# Patient Record
Sex: Female | Born: 1950 | Race: White | Hispanic: No | Marital: Married | Smoking: Never smoker
Health system: Southern US, Community
[De-identification: ages and names within clinical notes are randomized; demographics above are authoritative.]

## PROBLEM LIST (undated history)

## (undated) DIAGNOSIS — Z860100 Personal history of colon polyps, unspecified: Secondary | ICD-10-CM

## (undated) DIAGNOSIS — K589 Irritable bowel syndrome without diarrhea: Secondary | ICD-10-CM

## (undated) DIAGNOSIS — R630 Anorexia: Secondary | ICD-10-CM

## (undated) DIAGNOSIS — E2839 Other primary ovarian failure: Secondary | ICD-10-CM

## (undated) DIAGNOSIS — E288 Other ovarian dysfunction: Secondary | ICD-10-CM

## (undated) DIAGNOSIS — IMO0002 Reserved for concepts with insufficient information to code with codable children: Secondary | ICD-10-CM

## (undated) DIAGNOSIS — N951 Menopausal and female climacteric states: Secondary | ICD-10-CM

## (undated) DIAGNOSIS — M810 Age-related osteoporosis without current pathological fracture: Secondary | ICD-10-CM

## (undated) DIAGNOSIS — F319 Bipolar disorder, unspecified: Secondary | ICD-10-CM

## (undated) DIAGNOSIS — I700 Atherosclerosis of aorta: Secondary | ICD-10-CM

## (undated) HISTORY — DX: Age-related osteoporosis without current pathological fracture: M81.0

## (undated) HISTORY — DX: Personal history of colon polyps, unspecified: Z86.0100

## (undated) HISTORY — PX: TUBAL LIGATION: SHX77

## (undated) HISTORY — DX: Bipolar disorder, unspecified: F31.9

## (undated) HISTORY — DX: Reserved for concepts with insufficient information to code with codable children: IMO0002

## (undated) HISTORY — DX: Anorexia: R63.0

## (undated) HISTORY — DX: Personal history of colonic polyps: Z86.010

## (undated) HISTORY — DX: Other ovarian dysfunction: E28.8

## (undated) HISTORY — DX: Other primary ovarian failure: E28.39

## (undated) HISTORY — DX: Irritable bowel syndrome, unspecified: K58.9

## (undated) HISTORY — DX: Menopausal and female climacteric states: N95.1

## (undated) HISTORY — DX: Atherosclerosis of aorta: I70.0

## (undated) HISTORY — PX: TONSILLECTOMY AND ADENOIDECTOMY: SHX28

---

## 2010-10-01 LAB — HM MAMMOGRAPHY: HM Mammogram: NEGATIVE

## 2011-09-09 LAB — HM PAP SMEAR: HM Pap smear: NEGATIVE

## 2012-08-26 ENCOUNTER — Encounter: Payer: Self-pay | Admitting: *Deleted

## 2012-09-10 ENCOUNTER — Ambulatory Visit (INDEPENDENT_AMBULATORY_CARE_PROVIDER_SITE_OTHER): Payer: BC Managed Care – PPO | Admitting: Nurse Practitioner

## 2012-09-10 ENCOUNTER — Encounter: Payer: Self-pay | Admitting: Nurse Practitioner

## 2012-09-10 VITALS — BP 102/56 | HR 62 | Resp 12 | Ht 65.5 in | Wt 133.4 lb

## 2012-09-10 DIAGNOSIS — N952 Postmenopausal atrophic vaginitis: Secondary | ICD-10-CM

## 2012-09-10 DIAGNOSIS — Z0000 Encounter for general adult medical examination without abnormal findings: Secondary | ICD-10-CM

## 2012-09-10 DIAGNOSIS — Z01419 Encounter for gynecological examination (general) (routine) without abnormal findings: Secondary | ICD-10-CM

## 2012-09-10 DIAGNOSIS — M810 Age-related osteoporosis without current pathological fracture: Secondary | ICD-10-CM

## 2012-09-10 LAB — POCT URINALYSIS DIPSTICK
Leukocytes, UA: NEGATIVE
Spec Grav, UA: 1.015

## 2012-09-10 LAB — HEMOGLOBIN, FINGERSTICK: Hemoglobin, fingerstick: 14.1 g/dL (ref 12.0–16.0)

## 2012-09-10 MED ORDER — RALOXIFENE HCL 60 MG PO TABS: 60.0000 mg | ORAL_TABLET | Freq: Every day | ORAL | Status: DC

## 2012-09-10 MED ORDER — ESTRADIOL 10 MCG VA TABS: 10.0000 | ORAL_TABLET | VAGINAL | Status: DC

## 2012-09-10 NOTE — Patient Instructions (Signed)
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
## 2012-09-13 NOTE — Progress Notes (Signed)
Encounter reviewed by Dr. Brook Silva.  

## 2012-09-28 ENCOUNTER — Telehealth: Payer: Self-pay | Admitting: Nurse Practitioner

## 2012-09-28 NOTE — Telephone Encounter (Signed)
Patient called wanting to know about her schedule for her mammogram and bone density test.

## 2012-09-28 NOTE — Telephone Encounter (Signed)
Patient just saw Patty for AEX 09-10-12 but does not have any paper information to order BMD and MMG at [facility].  Would like to check on these tests.  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 Will schedule and call her back.

## 2012-09-29 ENCOUNTER — Telehealth: Payer: Self-pay | Admitting: *Deleted

## 2012-09-29 NOTE — Telephone Encounter (Signed)
See phone note for today 09/29/2012. Patient notified of appt. At [facility] for BMD and Mammogram.

## 2012-09-29 NOTE — Telephone Encounter (Signed)
Patient notified of scheduled test @ [facility] for Mammogram and Bone Density testing..July 30th,2014 @ arrive @8 :00am  For Dexa Scan @ 8:30am: Mammogram @ 9:00am. Form faxed to [facility].

## 2012-10-06 ENCOUNTER — Other Ambulatory Visit: Payer: Self-pay | Admitting: Nurse Practitioner

## 2012-10-06 NOTE — Telephone Encounter (Signed)
VAGIFEM RX sent on 09/10/12 during AEX visit.  RX denied.

## 2012-10-12 ENCOUNTER — Other Ambulatory Visit: Payer: Self-pay | Admitting: Nurse Practitioner

## 2012-10-12 DIAGNOSIS — N760 Acute vaginitis: Secondary | ICD-10-CM

## 2012-10-12 MED ORDER — METRONIDAZOLE 0.75 % VA GEL: 1.0000 | Freq: Every day | VAGINAL | Status: DC

## 2012-10-12 NOTE — Telephone Encounter (Signed)
Have patient to use Metrogel HS X 5 then go back to Vagifem, she may have a bacterial infection now.  If then no better will need to re-evaluate

## 2012-10-12 NOTE — Telephone Encounter (Signed)
Patient notified of response to use Metrogel HS x 5 days then go back to Vagifem. If no better will need office visit with Shirlyn Goltz. Rx sent to pharmacy per P.Grubb, FNP

## 2012-10-12 NOTE — Telephone Encounter (Signed)
Pt is still having the discharge that she was seen for before. May be worse. Please call to advise.

## 2012-10-12 NOTE — Telephone Encounter (Signed)
Patient seen 09/10/2012 AEX April Grubb,FNP Patient being Tx with Vagi Fem 3 x week vaginal Patient wanted to let April know that this is no better , No change in discharge , Vaginal dischage of grayish , green,. No odor, no itching. No pain. 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  Please advise.

## 2012-10-23 ENCOUNTER — Telehealth: Payer: Self-pay | Admitting: Nurse Practitioner

## 2012-10-23 DIAGNOSIS — N760 Acute vaginitis: Secondary | ICD-10-CM

## 2012-10-23 MED ORDER — METRONIDAZOLE 0.75 % VA GEL: 1.0000 | Freq: Every day | VAGINAL | Status: DC

## 2012-10-23 NOTE — Telephone Encounter (Signed)
Patient notified of verbal response from Shirlyn Goltz , FNP, to continue Metronidazole vaginal gel x 5 days for vaginal discharge . New Rx sent to pharmacy per patient request.

## 2012-10-23 NOTE — Telephone Encounter (Signed)
Patient was in recently for her annual and she was treated for vaginal discharge ,she was prescribed Metronidazole for this . Was told to let Patty know if this worked . She is calling in to day to let her know that she is still having this issue.

## 2012-10-23 NOTE — Telephone Encounter (Signed)
Patient last AEX 09/10/2012 with Patty Grubb,FNP. Patient states she used medication x 5 days as prescribed Metronidazole for vaginal discharge. States the discharge stopped but has now returned. States no odor., no itching, yellow green discharge, no SA. Patient states she has 1/2 tube left of Rx. Should she continue another 5 days. Please advise.

## 2013-01-14 ENCOUNTER — Other Ambulatory Visit: Payer: Self-pay

## 2013-07-01 HISTORY — PX: COLONOSCOPY: SHX174

## 2013-09-17 ENCOUNTER — Telehealth: Payer: Self-pay

## 2013-09-17 DIAGNOSIS — M810 Age-related osteoporosis without current pathological fracture: Secondary | ICD-10-CM

## 2013-09-17 MED ORDER — RALOXIFENE HCL 60 MG PO TABS: 60.0000 mg | ORAL_TABLET | Freq: Every day | ORAL | Status: DC

## 2013-09-17 NOTE — Telephone Encounter (Signed)
Last AEX: 09/10/13 Last refill:/09/10/12 #30, 12 refills Current AEX: 09/27/13  Sent 1 refill to pharmacy to cover pt until her AEX  Encounter closed

## 2013-09-27 ENCOUNTER — Encounter: Payer: Self-pay | Admitting: Nurse Practitioner

## 2013-09-27 ENCOUNTER — Ambulatory Visit (INDEPENDENT_AMBULATORY_CARE_PROVIDER_SITE_OTHER): Payer: BC Managed Care – PPO | Admitting: Nurse Practitioner

## 2013-09-27 VITALS — BP 116/80 | HR 72 | Ht 65.5 in | Wt 128.0 lb

## 2013-09-27 DIAGNOSIS — M810 Age-related osteoporosis without current pathological fracture: Secondary | ICD-10-CM

## 2013-09-27 DIAGNOSIS — Z9289 Personal history of other medical treatment: Secondary | ICD-10-CM

## 2013-09-27 DIAGNOSIS — Z0000 Encounter for general adult medical examination without abnormal findings: Secondary | ICD-10-CM

## 2013-09-27 DIAGNOSIS — Z01419 Encounter for gynecological examination (general) (routine) without abnormal findings: Secondary | ICD-10-CM

## 2013-09-27 DIAGNOSIS — Z9189 Other specified personal risk factors, not elsewhere classified: Secondary | ICD-10-CM

## 2013-09-27 DIAGNOSIS — N952 Postmenopausal atrophic vaginitis: Secondary | ICD-10-CM

## 2013-09-27 LAB — COMPREHENSIVE METABOLIC PANEL
ALBUMIN: 4 g/dL (ref 3.5–5.2)
ALT: 13 U/L (ref 0–35)
AST: 23 U/L (ref 0–37)
Alkaline Phosphatase: 61 U/L (ref 39–117)
BUN: 14 mg/dL (ref 6–23)
CALCIUM: 9.2 mg/dL (ref 8.4–10.5)
CHLORIDE: 107 meq/L (ref 96–112)
CO2: 29 meq/L (ref 19–32)
Creat: 0.93 mg/dL (ref 0.50–1.10)
GLUCOSE: 62 mg/dL — AB (ref 70–99)
POTASSIUM: 3.5 meq/L (ref 3.5–5.3)
SODIUM: 143 meq/L (ref 135–145)
TOTAL PROTEIN: 7 g/dL (ref 6.0–8.3)
Total Bilirubin: 0.4 mg/dL (ref 0.2–1.2)

## 2013-09-27 LAB — POCT URINALYSIS DIPSTICK
BILIRUBIN UA: NEGATIVE
GLUCOSE UA: NEGATIVE
Ketones, UA: NEGATIVE
LEUKOCYTES UA: NEGATIVE
NITRITE UA: NEGATIVE
Protein, UA: NEGATIVE
RBC UA: NEGATIVE
UROBILINOGEN UA: NEGATIVE
pH, UA: 6

## 2013-09-27 LAB — HEMOGLOBIN, FINGERSTICK: HEMOGLOBIN, FINGERSTICK: 13.8 g/dL (ref 12.0–16.0)

## 2013-09-27 LAB — LIPID PANEL
CHOLESTEROL: 160 mg/dL (ref 0–200)
HDL: 106 mg/dL (ref >39)
LDL Cholesterol: 46 mg/dL (ref 0–99)
TRIGLYCERIDES: 38 mg/dL (ref <150)
Total CHOL/HDL Ratio: 1.5 Ratio
VLDL: 8 mg/dL (ref 0–40)

## 2013-09-27 LAB — TSH: TSH: 3.416 u[IU]/mL (ref 0.350–4.500)

## 2013-09-27 MED ORDER — ESTRADIOL 10 MCG VA TABS: 1.0000 | ORAL_TABLET | VAGINAL | Status: DC

## 2013-09-27 MED ORDER — RALOXIFENE HCL 60 MG PO TABS: 60.0000 mg | ORAL_TABLET | Freq: Every day | ORAL | Status: DC

## 2013-09-27 MED ORDER — ESTRADIOL 10 MCG VA TABS: 10.0000 | ORAL_TABLET | VAGINAL | Status: DC

## 2013-09-27 NOTE — Patient Instructions (Signed)
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
## 2013-09-28 LAB — VITAMIN D 25 HYDROXY (VIT D DEFICIENCY, FRACTURES): Vit D, 25-Hydroxy: 78 ng/mL (ref 30–89)

## 2013-09-28 NOTE — Progress Notes (Signed)
Encounter reviewed by Dr. Shabnam Ladd Silva.  

## 2014-01-10 ENCOUNTER — Encounter: Payer: Self-pay | Admitting: Nurse Practitioner

## 2014-10-07 ENCOUNTER — Encounter: Payer: Self-pay | Admitting: Nurse Practitioner

## 2014-10-07 ENCOUNTER — Ambulatory Visit (INDEPENDENT_AMBULATORY_CARE_PROVIDER_SITE_OTHER): Payer: BC Managed Care – PPO | Admitting: Nurse Practitioner

## 2014-10-07 VITALS — BP 100/60 | HR 64 | Resp 16 | Ht 65.5 in | Wt 127.0 lb

## 2014-10-07 DIAGNOSIS — M810 Age-related osteoporosis without current pathological fracture: Secondary | ICD-10-CM

## 2014-10-07 DIAGNOSIS — Z01419 Encounter for gynecological examination (general) (routine) without abnormal findings: Secondary | ICD-10-CM | POA: Diagnosis not present

## 2014-10-07 DIAGNOSIS — Z0000 Encounter for general adult medical examination without abnormal findings: Secondary | ICD-10-CM | POA: Diagnosis not present

## 2014-10-07 LAB — POCT URINALYSIS DIPSTICK
Bilirubin, UA: NEGATIVE
Blood, UA: NEGATIVE
GLUCOSE UA: NEGATIVE
KETONES UA: NEGATIVE
Leukocytes, UA: NEGATIVE
NITRITE UA: NEGATIVE
Protein, UA: NEGATIVE
Urobilinogen, UA: NEGATIVE
pH, UA: 5

## 2014-10-07 LAB — COMPREHENSIVE METABOLIC PANEL
ALK PHOS: 56 U/L (ref 33–130)
ALT: 14 U/L (ref 6–29)
AST: 27 U/L (ref 10–35)
Albumin: 4.2 g/dL (ref 3.6–5.1)
BILIRUBIN TOTAL: 0.4 mg/dL (ref 0.2–1.2)
BUN: 12 mg/dL (ref 7–25)
CO2: 30 mmol/L (ref 20–31)
Calcium: 9.5 mg/dL (ref 8.6–10.4)
Chloride: 106 mmol/L (ref 98–110)
Creat: 0.91 mg/dL (ref 0.50–0.99)
GLUCOSE: 73 mg/dL (ref 65–99)
Potassium: 4.1 mmol/L (ref 3.5–5.3)
Sodium: 143 mmol/L (ref 135–146)
TOTAL PROTEIN: 7.2 g/dL (ref 6.1–8.1)

## 2014-10-07 LAB — LIPID PANEL
CHOL/HDL RATIO: 1.6 ratio (ref <=5.0)
CHOLESTEROL: 169 mg/dL (ref 125–200)
HDL: 105 mg/dL (ref >=46)
LDL Cholesterol: 49 mg/dL (ref <130)
TRIGLYCERIDES: 73 mg/dL (ref <150)
VLDL: 15 mg/dL (ref <30)

## 2014-10-07 LAB — VITAMIN D 25 HYDROXY (VIT D DEFICIENCY, FRACTURES): VIT D 25 HYDROXY: 57 ng/mL (ref 30–100)

## 2014-10-07 LAB — TSH: TSH: 3.514 u[IU]/mL (ref 0.350–4.500)

## 2014-10-07 MED ORDER — RALOXIFENE HCL 60 MG PO TABS: 60.0000 mg | ORAL_TABLET | Freq: Every day | ORAL | Status: DC

## 2014-10-07 NOTE — Patient Instructions (Signed)
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
## 2014-10-08 ENCOUNTER — Encounter: Payer: Self-pay | Admitting: Nurse Practitioner

## 2014-10-08 NOTE — Progress Notes (Signed)
Encounter reviewed by Dr. Aundria Rud. Patient will need follow up bone density.

## 2014-10-10 ENCOUNTER — Other Ambulatory Visit: Payer: Self-pay | Admitting: Nurse Practitioner

## 2014-10-10 NOTE — Telephone Encounter (Signed)
10/07/14 #30/12 rfs was sent to [facility] in [place]- rx denied.

## 2014-10-12 ENCOUNTER — Encounter: Payer: Self-pay | Admitting: Nurse Practitioner

## 2014-10-12 LAB — IPS PAP TEST WITH HPV

## 2014-10-12 LAB — HEMOGLOBIN, FINGERSTICK: Hemoglobin, fingerstick: 14.8 g/dL (ref 12.0–16.0)

## 2014-12-28 ENCOUNTER — Encounter: Payer: Self-pay | Admitting: Obstetrics and Gynecology

## 2015-04-19 HISTORY — PX: ESOPHAGOGASTRODUODENOSCOPY: SHX1529

## 2015-09-06 ENCOUNTER — Encounter: Payer: Self-pay | Admitting: Nurse Practitioner

## 2015-10-11 ENCOUNTER — Other Ambulatory Visit: Payer: Self-pay

## 2015-10-11 DIAGNOSIS — M810 Age-related osteoporosis without current pathological fracture: Secondary | ICD-10-CM

## 2015-10-11 MED ORDER — RALOXIFENE HCL 60 MG PO TABS: 60.0000 mg | ORAL_TABLET | Freq: Every day | ORAL | 0 refills | Status: DC

## 2015-10-11 NOTE — Telephone Encounter (Signed)
Medication refill request: Raloxifene 60mg  Last AEX:  10/07/14 PG Next AEX: 10/17/15 Last MMG (if hormonal medication request): 08/29/15 BI-RADS 1 negative Refill authorized: 10/07/14 #30 w/12 refills; today #30 w/0 refills? Patient has appt coming up 10/17/15

## 2015-10-12 ENCOUNTER — Other Ambulatory Visit: Payer: Self-pay

## 2015-10-12 DIAGNOSIS — M810 Age-related osteoporosis without current pathological fracture: Secondary | ICD-10-CM

## 2015-10-12 NOTE — Telephone Encounter (Signed)
Fax received from pharmacy requesting a 90-day supply instead of #30   Medication refill request: Raloxifene 60mg  Last AEX:  10/07/14 PG Next AEX: 10/17/15  Last MMG (if hormonal medication request): 08/29/15 BIRADS 1 negative Refill authorized: 10/07/14 #30 w/12 refills; today #90 w/0?

## 2015-10-13 MED ORDER — RALOXIFENE HCL 60 MG PO TABS: 60.0000 mg | ORAL_TABLET | Freq: Every day | ORAL | 0 refills | Status: DC

## 2015-10-17 ENCOUNTER — Encounter: Payer: Self-pay | Admitting: Nurse Practitioner

## 2015-10-17 ENCOUNTER — Ambulatory Visit (INDEPENDENT_AMBULATORY_CARE_PROVIDER_SITE_OTHER): Payer: BC Managed Care – PPO | Admitting: Nurse Practitioner

## 2015-10-17 VITALS — BP 120/76 | HR 64 | Ht 65.25 in | Wt 133.0 lb

## 2015-10-17 DIAGNOSIS — M810 Age-related osteoporosis without current pathological fracture: Secondary | ICD-10-CM | POA: Diagnosis not present

## 2015-10-17 DIAGNOSIS — Z79899 Other long term (current) drug therapy: Secondary | ICD-10-CM | POA: Diagnosis not present

## 2015-10-17 DIAGNOSIS — E559 Vitamin D deficiency, unspecified: Secondary | ICD-10-CM | POA: Diagnosis not present

## 2015-10-17 DIAGNOSIS — Z0000 Encounter for general adult medical examination without abnormal findings: Secondary | ICD-10-CM | POA: Diagnosis not present

## 2015-10-17 DIAGNOSIS — Z01419 Encounter for gynecological examination (general) (routine) without abnormal findings: Secondary | ICD-10-CM

## 2015-10-17 LAB — CBC WITH DIFFERENTIAL/PLATELET
BASOS PCT: 1 %
Basophils Absolute: 70 cells/uL (ref 0–200)
EOS ABS: 280 {cells}/uL (ref 15–500)
EOS PCT: 4 %
HCT: 43.5 % (ref 35.0–45.0)
HEMOGLOBIN: 14.6 g/dL (ref 11.7–15.5)
LYMPHS PCT: 22 %
Lymphs Abs: 1540 cells/uL (ref 850–3900)
MCH: 30.7 pg (ref 27.0–33.0)
MCHC: 33.6 g/dL (ref 32.0–36.0)
MCV: 91.6 fL (ref 80.0–100.0)
MONOS PCT: 6 %
MPV: 10 fL (ref 7.5–12.5)
Monocytes Absolute: 420 cells/uL (ref 200–950)
Neutro Abs: 4690 cells/uL (ref 1500–7800)
Neutrophils Relative %: 67 %
Platelets: 285 10*3/uL (ref 140–400)
RBC: 4.75 MIL/uL (ref 3.80–5.10)
RDW: 13.6 % (ref 11.0–15.0)
WBC: 7 10*3/uL (ref 3.8–10.8)

## 2015-10-17 LAB — POCT URINALYSIS DIPSTICK
BILIRUBIN UA: NEGATIVE
Glucose, UA: NEGATIVE
Ketones, UA: NEGATIVE
Leukocytes, UA: NEGATIVE
Nitrite, UA: NEGATIVE
Protein, UA: NEGATIVE
RBC UA: NEGATIVE
UROBILINOGEN UA: NEGATIVE
pH, UA: 7

## 2015-10-17 MED ORDER — RALOXIFENE HCL 60 MG PO TABS: 60.0000 mg | ORAL_TABLET | Freq: Every day | ORAL | 0 refills | Status: DC

## 2015-10-17 MED ORDER — RALOXIFENE HCL 60 MG PO TABS: 60.0000 mg | ORAL_TABLET | Freq: Every day | ORAL | 4 refills | Status: DC

## 2015-10-17 NOTE — Patient Instructions (Signed)
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
## 2015-10-17 NOTE — Progress Notes (Signed)
Reviewed personally.  M. Suzanne Maricela Schreur, MD.  

## 2015-10-18 LAB — LIPID PANEL
Cholesterol: 174 mg/dL (ref 125–200)
HDL: 115 mg/dL (ref >=46)
LDL Cholesterol: 50 mg/dL (ref <130)
Total CHOL/HDL Ratio: 1.5 Ratio (ref <=5.0)
Triglycerides: 47 mg/dL (ref <150)
VLDL: 9 mg/dL (ref <30)

## 2015-10-18 LAB — HEPATITIS C ANTIBODY: HCV AB: NEGATIVE

## 2015-10-18 LAB — COMPREHENSIVE METABOLIC PANEL
ALBUMIN: 4.1 g/dL (ref 3.6–5.1)
ALK PHOS: 68 U/L (ref 33–130)
ALT: 13 U/L (ref 6–29)
AST: 25 U/L (ref 10–35)
BILIRUBIN TOTAL: 0.3 mg/dL (ref 0.2–1.2)
BUN: 18 mg/dL (ref 7–25)
CO2: 24 mmol/L (ref 20–31)
CREATININE: 0.85 mg/dL (ref 0.50–0.99)
Calcium: 9.6 mg/dL (ref 8.6–10.4)
Chloride: 103 mmol/L (ref 98–110)
Glucose, Bld: 83 mg/dL (ref 65–99)
Potassium: 4.9 mmol/L (ref 3.5–5.3)
SODIUM: 142 mmol/L (ref 135–146)
TOTAL PROTEIN: 7.1 g/dL (ref 6.1–8.1)

## 2015-10-18 LAB — VITAMIN D 25 HYDROXY (VIT D DEFICIENCY, FRACTURES): VIT D 25 HYDROXY: 59 ng/mL (ref 30–100)

## 2015-10-18 LAB — HIV ANTIBODY (ROUTINE TESTING W REFLEX): HIV: NONREACTIVE

## 2015-10-18 LAB — TSH: TSH: 2.78 m[IU]/L

## 2015-10-19 LAB — HEMOGLOBIN, FINGERSTICK: Hemoglobin, fingerstick: 14.5 g/dL (ref 12.0–16.0)

## 2015-11-02 ENCOUNTER — Other Ambulatory Visit: Payer: Self-pay | Admitting: Nurse Practitioner

## 2015-11-02 DIAGNOSIS — M810 Age-related osteoporosis without current pathological fracture: Secondary | ICD-10-CM

## 2016-04-01 ENCOUNTER — Telehealth: Payer: Self-pay | Admitting: Nurse Practitioner

## 2016-04-01 NOTE — Telephone Encounter (Signed)
Call to [facility] [phone]. Fax [phone]. Patient scheduled for BMD 04/10/16 at 0900. Advise patient to arrive at 0830 and take no calcium or vitamins 24 hours prior.   Order to Kem Boroughs, NP to sign.  Spoke with patient, advised as seen above. Patient verbalizes understanding and is agreeable.  Routing to provider for final review. Patient is agreeable to disposition. Will close encounter.

## 2016-04-01 NOTE — Telephone Encounter (Signed)
Patient is calling to have an order sent to [facility] for a bone density test.

## 2016-04-16 ENCOUNTER — Telehealth: Payer: Self-pay | Admitting: Nurse Practitioner

## 2016-04-16 NOTE — Telephone Encounter (Addendum)
Please let patient know that BMD done on 04/10/16 shows a T Score at the spine of -1.8 and right hip at -2.8. The hip measurement puts her in Osteoporosis range.  In comparison to 2014 and 2012 she is declining in her hip measurements.  She has been on Evista but this is not stopping the bone loss.  Now needs to think about other treatment options.  She needs to see Dr. Quincy Simmonds here to discuss.

## 2016-04-17 NOTE — Telephone Encounter (Signed)
Spoke with patient, advised as seen below per Kem Boroughs, NP. Patient scheduled for 04/29/16 at 0930 with Dr. Quincy Simmonds. Patient verbalizes understanding and is agreeable.  Routing to provider for final review. Patient is agreeable to disposition. Will close encounter.  Copy of BMD results to Dr. Quincy Simmonds  Cc: Dr. Quincy Simmonds

## 2016-04-29 ENCOUNTER — Encounter: Payer: Self-pay | Admitting: Obstetrics and Gynecology

## 2016-04-29 ENCOUNTER — Ambulatory Visit (INDEPENDENT_AMBULATORY_CARE_PROVIDER_SITE_OTHER): Payer: BC Managed Care – PPO | Admitting: Obstetrics and Gynecology

## 2016-04-29 VITALS — BP 110/70 | HR 86 | Resp 16 | Ht 65.25 in | Wt 132.0 lb

## 2016-04-29 DIAGNOSIS — M810 Age-related osteoporosis without current pathological fracture: Secondary | ICD-10-CM | POA: Diagnosis not present

## 2016-04-29 MED ORDER — RISEDRONATE SODIUM 35 MG PO TABS: 35.0000 mg | ORAL_TABLET | ORAL | 2 refills | Status: DC

## 2016-04-29 NOTE — Patient Instructions (Signed)
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
## 2016-05-06 ENCOUNTER — Telehealth: Payer: Self-pay | Admitting: Nurse Practitioner

## 2016-05-06 MED ORDER — RALOXIFENE HCL 60 MG PO TABS: 60.0000 mg | ORAL_TABLET | Freq: Every day | ORAL | 4 refills | Status: DC

## 2016-05-06 NOTE — Telephone Encounter (Signed)
Spoke with patient. Advised of message as seen below from [place]. Patient verbalizes understanding. Rx for Evista 60 mg daily #90 4RF sent to pharmacy on file.  Routing to provider for final review. Patient agreeable to disposition. Will close encounter.

## 2016-05-06 NOTE — Telephone Encounter (Signed)
Spoke with patient. Patient took her first dose of Actonel on 04/30/2016. On Saturday 05/04/2016 she began to have severe heartburn and nausea for 12 hours. Reports she took first dose of Actonel with a full glass of water, did not eat, drink, or lay down for 30 minutes after taking. Reports she feels much better now and does not have any symptoms. Denies eating anything new or spicy that could have caused symptoms. Patient does not feel comfortable with these side effects. Requesting try an alternative. States she does not want to try Prolia or Reclast. Advised I will review with Dr.Silva and return call with further recommendations. Patient is agreeable. Advised not to take second dose of Actonel at this time until reviewed with Dr.Silva.

## 2016-05-06 NOTE — Telephone Encounter (Signed)
Patient can discontinue Actonel and return to Evista 60 mg daily until her next annual exam. Repeat BMD in 2 years.  Continue weight bearing exercise, calcium ( 3 - 4 servings daily) and vit D as we discussed.

## 2016-05-06 NOTE — Telephone Encounter (Signed)
Patient is having side effects from taking Actonel. She has been having heartburn and nausea.

## 2016-09-17 ENCOUNTER — Telehealth: Payer: Self-pay | Admitting: Obstetrics & Gynecology

## 2016-09-17 NOTE — Telephone Encounter (Signed)
Left message regarding upcoming appointment has been canceled and needs to be rescheduled. °

## 2016-10-21 ENCOUNTER — Ambulatory Visit: Payer: BC Managed Care – PPO | Admitting: Nurse Practitioner
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
## 2016-11-06 ENCOUNTER — Ambulatory Visit (INDEPENDENT_AMBULATORY_CARE_PROVIDER_SITE_OTHER): Payer: BC Managed Care – PPO | Admitting: Obstetrics and Gynecology

## 2016-11-06 ENCOUNTER — Encounter: Payer: Self-pay | Admitting: Obstetrics and Gynecology

## 2016-11-06 VITALS — BP 110/60 | HR 72 | Resp 16 | Ht 65.0 in | Wt 131.0 lb

## 2016-11-06 DIAGNOSIS — Z01419 Encounter for gynecological examination (general) (routine) without abnormal findings: Secondary | ICD-10-CM | POA: Diagnosis not present

## 2016-11-06 MED ORDER — RALOXIFENE HCL 60 MG PO TABS: 60.0000 mg | ORAL_TABLET | Freq: Every day | ORAL | 3 refills | Status: DC

## 2016-11-06 NOTE — Progress Notes (Signed)
Spoke with Estill Bamberg at [facility]. Patient scheduled while in office for screening MMG on 11/22/16 at 9am. Patient is agreeable to date and time.  Written order faxed to [facility] at [phone].

## 2016-11-06 NOTE — Patient Instructions (Signed)

## 2016-11-07 LAB — COMPREHENSIVE METABOLIC PANEL
A/G RATIO: 1.6 (ref 1.2–2.2)
ALT: 12 IU/L (ref 0–32)
AST: 29 IU/L (ref 0–40)
Albumin: 4.5 g/dL (ref 3.6–4.8)
Alkaline Phosphatase: 66 IU/L (ref 39–117)
BILIRUBIN TOTAL: 0.2 mg/dL (ref 0.0–1.2)
BUN/Creatinine Ratio: 15 (ref 12–28)
BUN: 15 mg/dL (ref 8–27)
CHLORIDE: 99 mmol/L (ref 96–106)
CO2: 26 mmol/L (ref 20–29)
Calcium: 10 mg/dL (ref 8.7–10.3)
Creatinine, Ser: 0.98 mg/dL (ref 0.57–1.00)
GFR calc non Af Amer: 62 mL/min/{1.73_m2} (ref >59)
GFR, EST AFRICAN AMERICAN: 71 mL/min/{1.73_m2} (ref >59)
Globulin, Total: 2.8 g/dL (ref 1.5–4.5)
Glucose: 85 mg/dL (ref 65–99)
POTASSIUM: 4.5 mmol/L (ref 3.5–5.2)
SODIUM: 140 mmol/L (ref 134–144)
Total Protein: 7.3 g/dL (ref 6.0–8.5)

## 2016-11-07 LAB — LIPID PANEL
CHOL/HDL RATIO: 1.6 ratio (ref 0.0–4.4)
Cholesterol, Total: 173 mg/dL (ref 100–199)
HDL: 106 mg/dL (ref >39)
LDL CALC: 56 mg/dL (ref 0–99)
TRIGLYCERIDES: 53 mg/dL (ref 0–149)
VLDL Cholesterol Cal: 11 mg/dL (ref 5–40)

## 2016-11-07 LAB — CBC
HEMOGLOBIN: 14.9 g/dL (ref 11.1–15.9)
Hematocrit: 45.6 % (ref 34.0–46.6)
MCH: 30.4 pg (ref 26.6–33.0)
MCHC: 32.7 g/dL (ref 31.5–35.7)
MCV: 93 fL (ref 79–97)
PLATELETS: 292 10*3/uL (ref 150–379)
RBC: 4.9 x10E6/uL (ref 3.77–5.28)
RDW: 13.9 % (ref 12.3–15.4)
WBC: 6.9 10*3/uL (ref 3.4–10.8)

## 2017-07-25 ENCOUNTER — Encounter: Payer: Self-pay | Admitting: Obstetrics and Gynecology
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
## 2017-11-28 ENCOUNTER — Ambulatory Visit: Payer: BC Managed Care – PPO | Admitting: Obstetrics and Gynecology

## 2017-11-28 ENCOUNTER — Other Ambulatory Visit (HOSPITAL_COMMUNITY)
Admission: RE | Admit: 2017-11-28 | Discharge: 2017-11-28 | Disposition: A | Discharge status: Home / Self Care | Payer: BC Managed Care – PPO | Source: Ambulatory Visit | Attending: Obstetrics and Gynecology | Admitting: Obstetrics and Gynecology

## 2017-11-28 ENCOUNTER — Encounter: Payer: Self-pay | Admitting: Obstetrics and Gynecology

## 2017-11-28 ENCOUNTER — Other Ambulatory Visit: Payer: Self-pay

## 2017-11-28 VITALS — BP 112/70 | HR 80 | Resp 16 | Ht 65.5 in | Wt 134.5 lb

## 2017-11-28 DIAGNOSIS — R1032 Left lower quadrant pain: Secondary | ICD-10-CM | POA: Diagnosis not present

## 2017-11-28 DIAGNOSIS — Z01419 Encounter for gynecological examination (general) (routine) without abnormal findings: Secondary | ICD-10-CM | POA: Insufficient documentation

## 2017-11-28 MED ORDER — RALOXIFENE HCL 60 MG PO TABS: 60.0000 mg | ORAL_TABLET | Freq: Every day | ORAL | 3 refills | Status: DC

## 2017-11-28 NOTE — Patient Instructions (Signed)

## 2017-11-29 LAB — CBC
HEMATOCRIT: 43.8 % (ref 34.0–46.6)
HEMOGLOBIN: 14.6 g/dL (ref 11.1–15.9)
MCH: 30.2 pg (ref 26.6–33.0)
MCHC: 33.3 g/dL (ref 31.5–35.7)
MCV: 91 fL (ref 79–97)
Platelets: 312 10*3/uL (ref 150–450)
RBC: 4.83 x10E6/uL (ref 3.77–5.28)
RDW: 12.8 % (ref 12.3–15.4)
WBC: 6.1 10*3/uL (ref 3.4–10.8)

## 2017-11-29 LAB — COMPREHENSIVE METABOLIC PANEL
ALBUMIN: 4.4 g/dL (ref 3.6–4.8)
ALT: 13 IU/L (ref 0–32)
AST: 26 IU/L (ref 0–40)
Albumin/Globulin Ratio: 1.5 (ref 1.2–2.2)
Alkaline Phosphatase: 64 IU/L (ref 39–117)
BUN / CREAT RATIO: 14 (ref 12–28)
BUN: 14 mg/dL (ref 8–27)
Bilirubin Total: <0.2 mg/dL (ref 0.0–1.2)
CO2: 26 mmol/L (ref 20–29)
CREATININE: 0.97 mg/dL (ref 0.57–1.00)
Calcium: 10 mg/dL (ref 8.7–10.3)
Chloride: 98 mmol/L (ref 96–106)
GFR, EST AFRICAN AMERICAN: 71 mL/min/{1.73_m2} (ref >59)
GFR, EST NON AFRICAN AMERICAN: 62 mL/min/{1.73_m2} (ref >59)
GLOBULIN, TOTAL: 2.9 g/dL (ref 1.5–4.5)
GLUCOSE: 83 mg/dL (ref 65–99)
Potassium: 4.7 mmol/L (ref 3.5–5.2)
SODIUM: 139 mmol/L (ref 134–144)
TOTAL PROTEIN: 7.3 g/dL (ref 6.0–8.5)

## 2017-11-29 LAB — LIPID PANEL
CHOL/HDL RATIO: 1.8 ratio (ref 0.0–4.4)
CHOLESTEROL TOTAL: 176 mg/dL (ref 100–199)
HDL: 99 mg/dL (ref >39)
LDL CALC: 66 mg/dL (ref 0–99)
TRIGLYCERIDES: 56 mg/dL (ref 0–149)
VLDL Cholesterol Cal: 11 mg/dL (ref 5–40)

## 2017-11-29 LAB — TSH: TSH: 2.25 u[IU]/mL (ref 0.450–4.500)

## 2017-11-29 LAB — VITAMIN D 25 HYDROXY (VIT D DEFICIENCY, FRACTURES): Vit D, 25-Hydroxy: 61.7 ng/mL (ref 30.0–100.0)

## 2017-12-01 LAB — CYTOLOGY - PAP: DIAGNOSIS: NEGATIVE

## 2017-12-04 ENCOUNTER — Ambulatory Visit: Payer: BC Managed Care – PPO | Admitting: Obstetrics and Gynecology

## 2017-12-04 ENCOUNTER — Encounter: Payer: Self-pay | Admitting: Obstetrics and Gynecology

## 2017-12-04 ENCOUNTER — Ambulatory Visit (INDEPENDENT_AMBULATORY_CARE_PROVIDER_SITE_OTHER): Payer: BC Managed Care – PPO

## 2017-12-04 VITALS — BP 122/64 | HR 66 | Resp 14 | Ht 65.5 in | Wt 134.0 lb

## 2017-12-04 DIAGNOSIS — G8929 Other chronic pain: Secondary | ICD-10-CM

## 2017-12-04 DIAGNOSIS — M545 Low back pain: Secondary | ICD-10-CM | POA: Diagnosis not present

## 2017-12-04 DIAGNOSIS — R1032 Left lower quadrant pain: Secondary | ICD-10-CM
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
## 2017-12-15 ENCOUNTER — Encounter: Payer: Self-pay | Admitting: Obstetrics and Gynecology

## 2018-02-18 DIAGNOSIS — F319 Bipolar disorder, unspecified: Secondary | ICD-10-CM | POA: Diagnosis not present

## 2018-02-19 DIAGNOSIS — C44519 Basal cell carcinoma of skin of other part of trunk: Secondary | ICD-10-CM | POA: Diagnosis not present

## 2018-02-19 DIAGNOSIS — L309 Dermatitis, unspecified: Secondary | ICD-10-CM | POA: Diagnosis not present

## 2018-02-19 DIAGNOSIS — B351 Tinea unguium: Secondary | ICD-10-CM | POA: Diagnosis not present

## 2018-02-19 DIAGNOSIS — L299 Pruritus, unspecified: Secondary | ICD-10-CM | POA: Diagnosis not present

## 2018-02-23 ENCOUNTER — Telehealth: Payer: Self-pay

## 2018-02-23 NOTE — Telephone Encounter (Signed)
Dr. Lyndel Safe, please advise on next colonoscopy date-previous patient from [facility];

## 2018-02-24 NOTE — Telephone Encounter (Signed)
This is on your desk for review.

## 2018-02-24 NOTE — Telephone Encounter (Signed)
April Duffy Can you find out when is the recall from our old system and infrom. Also, add to the new recall log. If any problems, pl print out the last colon report/biopsies and let me know. Thanks RG

## 2018-02-26 NOTE — Telephone Encounter (Signed)
Recall colonoscopy was 06/2016. Can set her up for colonoscopy for history of tubular adenomas. Please inform the patient

## 2018-02-27 NOTE — Telephone Encounter (Signed)
Called patient and left message for her to call back and schedule a pre-visit appointment.

## 2018-02-27 NOTE — Telephone Encounter (Signed)
Scheduled patient for a visit.

## 2018-03-09 ENCOUNTER — Encounter: Payer: Self-pay | Admitting: Gastroenterology

## 2018-03-16 ENCOUNTER — Ambulatory Visit (INDEPENDENT_AMBULATORY_CARE_PROVIDER_SITE_OTHER): Payer: PPO | Admitting: Gastroenterology

## 2018-03-16 ENCOUNTER — Encounter: Payer: Self-pay | Admitting: Gastroenterology

## 2018-03-16 VITALS — BP 128/84 | HR 73 | Ht 65.0 in | Wt 136.5 lb

## 2018-03-16 DIAGNOSIS — Z86010 Personal history of colonic polyps: Secondary | ICD-10-CM | POA: Diagnosis not present

## 2018-03-16 MED ORDER — SUPREP BOWEL PREP KIT 17.5-3.13-1.6 GM/177ML PO SOLN: 1.0000 | ORAL | 0 refills | Status: DC
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
## 2018-03-16 NOTE — Patient Instructions (Signed)
If you are age 67 or older, your body mass index should be between 23-30. Your Body mass index is 22.71 kg/m. If this is out of the aforementioned range listed, please consider follow up with your Primary Care Provider.  If you are age 68 or younger, your body mass index should be between 19-25. Your Body mass index is 22.71 kg/m. If this is out of the aformentioned range listed, please consider follow up with your Primary Care Provider.   You have been scheduled for a colonoscopy. Please follow written instructions given to you at your visit today.  Please pick up your prep supplies at the pharmacy within the next 1-3 days. If you use inhalers (even only as needed), please bring them with you on the day of your procedure. Your physician has requested that you go to www.startemmi.com and enter the access code given to you at your visit today. This web site gives a general overview about your procedure. However, you should still follow specific instructions given to you by our office regarding your preparation for the procedure.  We have given you samples of the following medication to take: Suprep  Thank you,  Dr. Jackquline Denmark

## 2018-03-25 ENCOUNTER — Encounter: Payer: Self-pay | Admitting: Gastroenterology

## 2018-03-25 ENCOUNTER — Ambulatory Visit (AMBULATORY_SURGERY_CENTER): Payer: PPO | Admitting: Gastroenterology

## 2018-03-25 VITALS — BP 116/68 | HR 60 | Temp 97.5°F | Resp 12 | Ht 65.0 in | Wt 136.0 lb

## 2018-03-25 DIAGNOSIS — K219 Gastro-esophageal reflux disease without esophagitis: Secondary | ICD-10-CM | POA: Diagnosis not present

## 2018-03-25 DIAGNOSIS — Z86010 Personal history of colonic polyps: Secondary | ICD-10-CM | POA: Diagnosis not present

## 2018-03-25 MED ORDER — SODIUM CHLORIDE 0.9 % IV SOLN: 500.0000 mL | INTRAVENOUS | Status: DC

## 2018-03-25 NOTE — Op Note (Signed)
[place] Patient Name: April Duffy Procedure Date: 03/25/2018 8:04 AM MRN: [id number] Endoscopist: Jackquline Denmark , MD Age: 68 Referring MD:  Date of Birth: 10-27-1950 Gender: Female Account #: [id number] Procedure:                Colonoscopy Indications:              High risk colon cancer surveillance: Personal                            history of colonic polyps Medicines:                Monitored Anesthesia Care Procedure:                Pre-Anesthesia Assessment:                           - Prior to the procedure, a History and Physical                            was performed, and patient medications and                            allergies were reviewed. The patient's tolerance of                            previous anesthesia was also reviewed. The risks                            and benefits of the procedure and the sedation                            options and risks were discussed with the patient.                            All questions were answered, and informed consent                            was obtained. Prior Anticoagulants: The patient has                            taken no previous anticoagulant or antiplatelet                            agents. ASA Grade Assessment: II - A patient with                            mild systemic disease. After reviewing the risks                            and benefits, the patient was deemed in                            satisfactory condition to undergo the procedure.  After obtaining informed consent, the colonoscope                            was passed under direct vision. Throughout the                            procedure, the patient's blood pressure, pulse, and                            oxygen saturations were monitored continuously. The                            Colonoscope was introduced through the anus and                            advanced to the 2 cm into the ileum. The                             colonoscopy was performed without difficulty. The                            patient tolerated the procedure well. The quality                            of the bowel preparation was excellent except in                            some areas of the right colon where there was some                            stool. Aggressive suctioning and aspiration was                            performed. The patient also turn from side to the                            back. Overall the examination was adequate. Colon                            was highly redundant. Scope In: 8:15:46 AM Scope Out: 8:33:50 AM Scope Withdrawal Time: 0 hours 11 minutes 28 seconds  Total Procedure Duration: 0 hours 18 minutes 4 seconds  Findings:                 A few rare small-mouthed diverticula were found in                            the sigmoid colon.                           Non-bleeding internal hemorrhoids were found during                            retroflexion. The hemorrhoids were small.  The terminal ileum appeared normal.                           The exam was otherwise without abnormality on                            direct and retroflexion views. Colon was highly                            redundant. Complications:            No immediate complications. Estimated Blood Loss:     Estimated blood loss: none. Impression:               -Minimal sigmoid diverticulosis.                           -Small Non-bleeding internal hemorrhoids.                           -Otherwise normal colonoscopy to TI. Recommendation:           - Patient has a contact number available for                            emergencies. The signs and symptoms of potential                            delayed complications were discussed with the                            patient. Return to normal activities tomorrow.                            Written discharge instructions were provided  to the                            patient.                           - Resume previous diet.                           - Continue present medications.                           - Repeat colonoscopy in 5 years for screening                            purposes due to his history of significant polyps..                           - Return to GI clinic PRN. Jackquline Denmark, MD 03/25/2018 8:41:16 AM This report has been signed electronically.

## 2018-03-25 NOTE — Patient Instructions (Signed)
YOU HAD AN ENDOSCOPIC PROCEDURE TODAY AT [facility]:   Refer to the procedure report that was given to you for any specific questions about what was found during the examination.  If the procedure report does not answer your questions, please call your gastroenterologist to clarify.  If you requested that your care partner not be given the details of your procedure findings, then the procedure report has been included in a sealed envelope for you to review at your convenience later.  YOU SHOULD EXPECT: Some feelings of bloating in the abdomen. Passage of more gas than usual.  Walking can help get rid of the air that was put into your GI tract during the procedure and reduce the bloating. If you had a lower endoscopy (such as a colonoscopy or flexible sigmoidoscopy) you may notice spotting of blood in your stool or on the toilet paper. If you underwent a bowel prep for your procedure, you may not have a normal bowel movement for a few days.  Please Note:  You might notice some irritation and congestion in your nose or some drainage.  This is from the oxygen used during your procedure.  There is no need for concern and it should clear up in a day or so.  SYMPTOMS TO REPORT IMMEDIATELY:   Following lower endoscopy (colonoscopy or flexible sigmoidoscopy):  Excessive amounts of blood in the stool  Significant tenderness or worsening of abdominal pains  Swelling of the abdomen that is new, acute  Fever of 100F or higher  For urgent or emergent issues, a gastroenterologist can be reached at any hour by calling [phone].   DIET:  We do recommend a small meal at first, but then you may proceed to your regular diet.  Drink plenty of fluids but you should avoid alcoholic beverages for 24 hours.  Try to eat a high fiber diet, and drink plenty of water.  ACTIVITY:  You should plan to take it easy for the rest of today and you should NOT DRIVE or use heavy machinery until tomorrow  (because of the sedation medicines used during the test).    FOLLOW UP: Our staff will call the number listed on your records the next business day following your procedure to check on you and address any questions or concerns that you may have regarding the information given to you following your procedure. If we do not reach you, we will leave a message.  However, if you are feeling well and you are not experiencing any problems, there is no need to return our call.  We will assume that you have returned to your regular daily activities without incident.   SIGNATURES/CONFIDENTIALITY: You and/or your care partner have signed paperwork which will be entered into your electronic medical record.  These signatures attest to the fact that that the information above on your After Visit Summary has been reviewed and is understood.  Full responsibility of the confidentiality of this discharge information lies with you and/or your care-partner.  Read all of the handouts given to you by your recovery room nurse.  Recall 10 years.

## 2018-03-25 NOTE — Progress Notes (Signed)
Report given to PACU, vss 

## 2018-03-26 ENCOUNTER — Telehealth: Payer: Self-pay

## 2018-03-26 ENCOUNTER — Telehealth: Payer: Self-pay | Admitting: *Deleted

## 2018-03-26 NOTE — Telephone Encounter (Signed)
  Follow up Call-  Call back number 03/25/2018  Post procedure Call Back phone  # [phone]  Permission to leave phone message Yes  Some recent data might be hidden     Patient questions:  Do you have a fever, pain , or abdominal swelling? No Pain Score  0  Have you tolerated food without any problems? yes  Have you been able to return to your normal activities? yes  Do you have any questions about your discharge instructions: Diet   no Medications  no Follow up visit  no  Do you have questions or concerns about your Care? no  Actions: * If pain score is 4 or above: Pain 0

## 2018-03-26 NOTE — Telephone Encounter (Signed)
  Follow up Call-  Call back number 03/25/2018  Post procedure Call Back phone  # [phone]  Permission to leave phone message Yes  Some recent data might be hidden     Patient questions:  Do you have a fever, pain , or abdominal swelling? No. Pain Score  0 *  Have you tolerated food without any problems? Yes.    Have you been able to return to your normal activities? Yes.    Do you have any questions about your discharge instructions: Diet   No. Medications  No. Follow up visit  No.  Do you have questions or concerns about your Care? No.  Actions: * If pain score is 4 or above: No action needed, pain <4.

## 2018-03-26 NOTE — Telephone Encounter (Signed)
  Follow up Call-  Call back number 03/25/2018  Post procedure Call Back phone  # [phone]  Permission to leave phone message Yes  Some recent data might be hidden    [facility]

## 2018-05-04 ENCOUNTER — Telehealth: Payer: Self-pay | Admitting: Obstetrics and Gynecology

## 2018-05-04 NOTE — Telephone Encounter (Signed)
Spoke with patient. Patient states unable to schedule BMD at [facility] with written order provided at [facility]. Advised patient will fax new order for BMD to [facility]. Patient thankful for return call.

## 2018-05-04 NOTE — Telephone Encounter (Signed)
BMD order faxed to [facility].   Encounter closed.

## 2018-05-04 NOTE — Telephone Encounter (Signed)
Spoke with Isela. Patient needs BMD order faxed to [phone].   Last BMD 04/10/16: Osteoporosis, on Evista.  Last [facility] 11/28/17  Written order to Dr. Quincy Simmonds for signature.

## 2018-05-04 NOTE — Telephone Encounter (Signed)
Patient says she has an order for bone density scan but [facility] says our office need to call them. Their number [phone] extension [phone]

## 2018-05-11 DIAGNOSIS — M810 Age-related osteoporosis without current pathological fracture: Secondary | ICD-10-CM | POA: Diagnosis not present

## 2018-05-20 DIAGNOSIS — F319 Bipolar disorder, unspecified: Secondary | ICD-10-CM | POA: Diagnosis not present

## 2018-07-27 ENCOUNTER — Telehealth: Payer: Self-pay | Admitting: Obstetrics and Gynecology

## 2018-07-27 NOTE — Telephone Encounter (Signed)
Please contact patient in follow up to her BMD and mammogram received from [facility].   Her BMD shows osteoporosis of the hips and osteopenia of the spine.  The hips are slightly improved and the spine is unchanged from 2018.   Her mammogram is normal.  She may continue with the Evista. Our alternative is for her to use Prolia, which is an injection given every 6 months.   Prolia is more effective for treating osteoporosis, but it is a completely different medication.   If she would like to consider other options, she can schedule a WebEx visit or come in for an office with me.

## 2018-07-27 NOTE — Telephone Encounter (Signed)
Left message to call Kaitlyn at [phone]. 

## 2018-07-30 NOTE — Telephone Encounter (Signed)
Left message to call Claribel Sachs, RN at [facility] [phone].   

## 2018-07-30 NOTE — Telephone Encounter (Signed)
Spoke with patient, advised as seen below per Dr. Quincy Simmonds. Patient would like to continue Evista at this time, will further discuss at [facility] 12/02/18. Declines OV at this time, will return call to office for OV if desire earlier OV or WebEx. Patient verbalizes understanding and is agreeable..   Copy of BMD and MMG reports to scan.   Routing to provider for final review. Patient is agreeable to disposition. Will close encounter.

## 2018-08-19 DIAGNOSIS — F319 Bipolar disorder, unspecified: Secondary | ICD-10-CM | POA: Diagnosis not present

## 2018-10-01 DIAGNOSIS — Z6820 Body mass index (BMI) 20.0-20.9, adult: Secondary | ICD-10-CM | POA: Diagnosis not present

## 2018-10-01 DIAGNOSIS — L259 Unspecified contact dermatitis, unspecified cause: Secondary | ICD-10-CM | POA: Diagnosis not present

## 2018-10-06 ENCOUNTER — Other Ambulatory Visit: Payer: Self-pay | Admitting: *Deleted

## 2018-10-06 NOTE — Telephone Encounter (Signed)
Medication refill request: Raloxifene 60mg  Last [facility]:  11/28/17 Dr. Quincy Simmonds Next [facility]EX: 12/02/18  Last MMG (if hormonal medication request): 01/02/18 BIRADS1:neg  Refill authorized: 11/28/17 #90tabs/3R. Today #90/0R?

## 2018-10-07 MED ORDER — RALOXIFENE HCL 60 MG PO TABS: 60.0000 mg | ORAL_TABLET | Freq: Every day | ORAL | 0 refills | Status: DC

## 2018-10-20 DIAGNOSIS — Z0289 Encounter for other administrative examinations: Secondary | ICD-10-CM

## 2018-11-18 DIAGNOSIS — F319 Bipolar disorder, unspecified: Secondary | ICD-10-CM | POA: Diagnosis not present

## 2018-11-27 ENCOUNTER — Other Ambulatory Visit: Payer: Self-pay

## 2018-12-02 ENCOUNTER — Ambulatory Visit (INDEPENDENT_AMBULATORY_CARE_PROVIDER_SITE_OTHER): Payer: PPO | Admitting: Obstetrics and Gynecology

## 2018-12-02 ENCOUNTER — Encounter: Payer: Self-pay | Admitting: Obstetrics and Gynecology

## 2018-12-02 ENCOUNTER — Other Ambulatory Visit: Payer: Self-pay

## 2018-12-02 VITALS — BP 112/70 | HR 80 | Temp 97.2°F | Resp 12 | Ht 65.5 in | Wt 130.2 lb

## 2018-12-02 DIAGNOSIS — Z01419 Encounter for gynecological examination (general) (routine) without abnormal findings: Secondary | ICD-10-CM

## 2018-12-02 MED ORDER — RALOXIFENE HCL 60 MG PO TABS: 60.0000 mg | ORAL_TABLET | Freq: Every day | ORAL | 3 refills | Status: DC

## 2018-12-02 NOTE — Patient Instructions (Signed)
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
## 2018-12-11 ENCOUNTER — Telehealth: Payer: Self-pay | Admitting: Obstetrics and Gynecology

## 2018-12-11 NOTE — Telephone Encounter (Signed)
Spoke with patient, patient request morning appt.   Last screening MMG 01/02/18  Call placed to [facility]. Spoke with Danae Chen. Screening MMG scheduled for 02/16/2019 at 9:15am, arrive at 8:45am. Fax order to [phone]   Patient notified of appt and is agreeable to date and time.    Written order to Dr. Quincy Simmonds for signature and faxing.   Routing to provider for final review. Patient is agreeable to disposition. Will close encounter.

## 2018-12-11 NOTE — Telephone Encounter (Signed)
Patient says our office will need to call and schedule her mammogram with [facility], [place].

## 2018-12-30 DIAGNOSIS — Z23 Encounter for immunization: Secondary | ICD-10-CM | POA: Diagnosis not present

## 2018-12-30 DIAGNOSIS — Z6820 Body mass index (BMI) 20.0-20.9, adult: Secondary | ICD-10-CM | POA: Diagnosis not present

## 2018-12-30 DIAGNOSIS — Z5181 Encounter for therapeutic drug level monitoring: Secondary | ICD-10-CM | POA: Diagnosis not present

## 2019-01-04 ENCOUNTER — Telehealth: Payer: Self-pay | Admitting: Gastroenterology

## 2019-01-04 NOTE — Telephone Encounter (Signed)
Pt reported that she has been taking omeprazole for a year and is concerned about taking medications for a prolonged amount of time.  Please call back to discuss.

## 2019-01-04 NOTE — Telephone Encounter (Signed)
I have called and spoke with patient and she said that she is currently taking Omeprazole 20 mg once daily OTC. Patient said she has tried to wean herself off of it and skip a day here and there but her symptoms returned. She is concerned about taking this everyday and just wanted to make sure you thought that it was ok.

## 2019-01-05 NOTE — Telephone Encounter (Signed)
Should be all right Can certainly try it every other day. Should increase calcium intake -like calcium tablets 1500 mg p.o. once a day Should have follow-up in 6 months or so to discuss more  RG

## 2019-01-06 NOTE — Telephone Encounter (Signed)
I have called patient and left message for her to return my call.  

## 2019-01-06 NOTE — Telephone Encounter (Signed)
Patient returned called. Told her that per Dr Steve Rattler note she can try the every other day if she wants and that she can increase her calcium intake to 1500mg  by mouth once a day but follow up in 6 months but id she have symptoms sooner please call us

## 2019-01-26 DIAGNOSIS — M25552 Pain in left hip: Secondary | ICD-10-CM | POA: Diagnosis not present

## 2019-01-26 DIAGNOSIS — Z6820 Body mass index (BMI) 20.0-20.9, adult: Secondary | ICD-10-CM | POA: Diagnosis not present

## 2019-01-26 DIAGNOSIS — Z9181 History of falling: Secondary | ICD-10-CM | POA: Diagnosis not present

## 2019-02-09 DIAGNOSIS — M25552 Pain in left hip: Secondary | ICD-10-CM | POA: Diagnosis not present

## 2019-02-09 DIAGNOSIS — M545 Low back pain: Secondary | ICD-10-CM | POA: Diagnosis not present

## 2019-02-16 DIAGNOSIS — Z1231 Encounter for screening mammogram for malignant neoplasm of breast: Secondary | ICD-10-CM | POA: Diagnosis not present

## 2019-02-17 DIAGNOSIS — F319 Bipolar disorder, unspecified: Secondary | ICD-10-CM | POA: Diagnosis not present

## 2019-05-12 DIAGNOSIS — F319 Bipolar disorder, unspecified: Secondary | ICD-10-CM | POA: Diagnosis not present

## 2019-07-08 DIAGNOSIS — M545 Low back pain: Secondary | ICD-10-CM | POA: Diagnosis not present

## 2019-07-08 DIAGNOSIS — M47816 Spondylosis without myelopathy or radiculopathy, lumbar region: Secondary | ICD-10-CM | POA: Diagnosis not present

## 2019-07-08 DIAGNOSIS — Q762 Congenital spondylolisthesis: Secondary | ICD-10-CM | POA: Diagnosis not present

## 2019-07-08 DIAGNOSIS — M4316 Spondylolisthesis, lumbar region: Secondary | ICD-10-CM | POA: Diagnosis not present

## 2019-07-19 DIAGNOSIS — M4807 Spinal stenosis, lumbosacral region: Secondary | ICD-10-CM | POA: Diagnosis not present

## 2019-07-19 DIAGNOSIS — M4316 Spondylolisthesis, lumbar region: Secondary | ICD-10-CM | POA: Diagnosis not present

## 2019-07-19 DIAGNOSIS — M5126 Other intervertebral disc displacement, lumbar region: Secondary | ICD-10-CM | POA: Diagnosis not present

## 2019-07-27 DIAGNOSIS — M4316 Spondylolisthesis, lumbar region: Secondary | ICD-10-CM | POA: Diagnosis not present

## 2019-07-27 DIAGNOSIS — Q762 Congenital spondylolisthesis: Secondary | ICD-10-CM | POA: Diagnosis not present

## 2019-07-30 ENCOUNTER — Ambulatory Visit: Payer: PPO | Admitting: Gastroenterology

## 2019-08-11 DIAGNOSIS — F319 Bipolar disorder, unspecified: Secondary | ICD-10-CM | POA: Diagnosis not present

## 2019-09-07 ENCOUNTER — Other Ambulatory Visit (INDEPENDENT_AMBULATORY_CARE_PROVIDER_SITE_OTHER): Payer: PPO

## 2019-09-07 ENCOUNTER — Ambulatory Visit (INDEPENDENT_AMBULATORY_CARE_PROVIDER_SITE_OTHER): Payer: PPO | Admitting: Gastroenterology

## 2019-09-07 ENCOUNTER — Encounter: Payer: Self-pay | Admitting: Gastroenterology

## 2019-09-07 VITALS — BP 122/74 | HR 78 | Temp 97.3°F | Ht 65.0 in | Wt 138.1 lb

## 2019-09-07 DIAGNOSIS — R1032 Left lower quadrant pain: Secondary | ICD-10-CM | POA: Diagnosis not present

## 2019-09-07 DIAGNOSIS — K581 Irritable bowel syndrome with constipation: Secondary | ICD-10-CM | POA: Diagnosis not present

## 2019-09-07 LAB — CBC WITH DIFFERENTIAL/PLATELET
Basophils Absolute: 0.1 10*3/uL (ref 0.0–0.1)
Basophils Relative: 0.9 % (ref 0.0–3.0)
Eosinophils Absolute: 0.1 10*3/uL (ref 0.0–0.7)
Eosinophils Relative: 2.1 % (ref 0.0–5.0)
HCT: 42.1 % (ref 36.0–46.0)
Hemoglobin: 14.1 g/dL (ref 12.0–15.0)
Lymphocytes Relative: 28.1 % (ref 12.0–46.0)
Lymphs Abs: 1.7 10*3/uL (ref 0.7–4.0)
MCHC: 33.5 g/dL (ref 30.0–36.0)
MCV: 94.8 fl (ref 78.0–100.0)
Monocytes Absolute: 0.4 10*3/uL (ref 0.1–1.0)
Monocytes Relative: 7 % (ref 3.0–12.0)
Neutro Abs: 3.8 10*3/uL (ref 1.4–7.7)
Neutrophils Relative %: 61.9 % (ref 43.0–77.0)
Platelets: 279 10*3/uL (ref 150.0–400.0)
RBC: 4.44 Mil/uL (ref 3.87–5.11)
RDW: 13 % (ref 11.5–15.5)
WBC: 6.1 10*3/uL (ref 4.0–10.5)

## 2019-09-07 LAB — COMPREHENSIVE METABOLIC PANEL
ALT: 12 U/L (ref 0–35)
AST: 22 U/L (ref 0–37)
Albumin: 4.3 g/dL (ref 3.5–5.2)
Alkaline Phosphatase: 61 U/L (ref 39–117)
BUN: 19 mg/dL (ref 6–23)
CO2: 32 mEq/L (ref 19–32)
Calcium: 10.2 mg/dL (ref 8.4–10.5)
Chloride: 101 mEq/L (ref 96–112)
Creatinine, Ser: 0.9 mg/dL (ref 0.40–1.20)
GFR: 62.55 mL/min (ref >60.00)
Glucose, Bld: 91 mg/dL (ref 70–99)
Potassium: 4.4 mEq/L (ref 3.5–5.1)
Sodium: 140 mEq/L (ref 135–145)
Total Bilirubin: 0.3 mg/dL (ref 0.2–1.2)
Total Protein: 7.2 g/dL (ref 6.0–8.3)

## 2019-09-07 LAB — HIGH SENSITIVITY CRP: CRP, High Sensitivity: 7.21 mg/L — ABNORMAL HIGH (ref 0.000–5.000)
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
## 2019-09-07 NOTE — Patient Instructions (Addendum)
If you are age 67 or older, your body mass index should be between 23-30. Your Body mass index is 22.99 kg/m. If this is out of the aforementioned range listed, please consider follow up with your Primary Care Provider.  If you are age 64 or younger, your body mass index should be between 19-25. Your Body mass index is 22.99 kg/m. If this is out of the aformentioned range listed, please consider follow up with your Primary Care Provider.   You have been scheduled for a CT scan of the abdomen and pelvis at [facility]. You are scheduled on 09/17/19 at 8:30. You should arrive 15 minutes prior to your appointment time for registration. Please follow the written instructions below on the day of your exam:  WARNING: IF YOU ARE ALLERGIC TO IODINE/X-RAY DYE, PLEASE NOTIFY RADIOLOGY IMMEDIATELY AT [phone]! YOU WILL BE GIVEN A 13 HOUR PREMEDICATION PREP.  1) Do not eat or drink anything after 4:30 am (4 hours prior to your test) 2) You have been given 2 bottles of oral contrast to drink. The solution may taste better if refrigerated, but do NOT add ice or any other liquid to this solution. Shake well before drinking.    Drink 1 bottle of contrast @ 6:30 am (2 hours prior to your exam)  Drink 1 bottle of contrast @ 7:30 am (1 hour prior to your exam)  You may take any medications as prescribed with a small amount of water, if necessary. If you take any of the following medications: METFORMIN, GLUCOPHAGE, GLUCOVANCE, AVANDAMET, RIOMET, FORTAMET, [place] MET, JANUMET, GLUMETZA or METAGLIP, you MAY be asked to HOLD this medication 48 hours AFTER the exam.  The purpose of you drinking the oral contrast is to aid in the visualization of your intestinal tract. The contrast solution may cause some diarrhea. Depending on your individual set of symptoms, you may also receive an intravenous injection of x-ray contrast/dye. Plan on being at [facility] for 30 minutes or longer, depending on the  type of exam you are having performed.  This test typically takes 30-45 minutes to complete.  If you have any questions regarding your exam or if you need to reschedule, you may call the CT department at [phone] between the hours of 8:00 am and 5:00 pm, Monday-Friday.  ________________________________________________________________________  Your provider has requested that you go to the basement level for lab work at [place] [place] [employer] . Press "B" on the elevator. The lab is located at the first door on the left as you exit the elevator  Continue Miralax 3 x a week.  Can use Biofreeze and heating pads..  Thank you for choosing me and [facility].   Jackquline Denmark, MD

## 2019-09-16 DIAGNOSIS — M4317 Spondylolisthesis, lumbosacral region: Secondary | ICD-10-CM | POA: Diagnosis not present

## 2019-09-17 ENCOUNTER — Ambulatory Visit (HOSPITAL_COMMUNITY)
Admission: RE | Admit: 2019-09-17 | Discharge: 2019-09-17 | Disposition: A | Discharge status: Home / Self Care | Payer: PPO | Source: Ambulatory Visit | Attending: Gastroenterology | Admitting: Gastroenterology

## 2019-09-17 ENCOUNTER — Other Ambulatory Visit: Payer: Self-pay

## 2019-09-17 ENCOUNTER — Encounter (HOSPITAL_COMMUNITY): Payer: Self-pay

## 2019-09-17 DIAGNOSIS — R1032 Left lower quadrant pain: Secondary | ICD-10-CM

## 2019-09-17 DIAGNOSIS — M4319 Spondylolisthesis, multiple sites in spine: Secondary | ICD-10-CM | POA: Diagnosis not present

## 2019-09-17 DIAGNOSIS — I700 Atherosclerosis of aorta: Secondary | ICD-10-CM | POA: Diagnosis not present

## 2019-09-17 DIAGNOSIS — M47816 Spondylosis without myelopathy or radiculopathy, lumbar region: Secondary | ICD-10-CM | POA: Diagnosis not present

## 2019-09-17 MED ORDER — IOHEXOL 300 MG/ML  SOLN
100.0000 mL | Freq: Once | INTRAMUSCULAR | Status: AC | PRN
  Administered 2019-09-17: 100 mL via INTRAVENOUS

## 2019-09-17 MED ORDER — SODIUM CHLORIDE (PF) 0.9 % IJ SOLN
INTRAMUSCULAR | Status: AC
  Filled 2019-09-17: qty 50

## 2019-09-20 ENCOUNTER — Telehealth: Payer: Self-pay | Admitting: Gastroenterology

## 2019-09-20 NOTE — Telephone Encounter (Signed)
CT Abdo/pelvis-there is no comment about the heart.   Regardless, due to strong family history, left upper quadrant/left chest pain, she should see cardiology  Please refer her to cardiology group in [facility] med center (Dr K/Dr. Munley/Dr. Geraldo Pitter) -whoever can see her earlier   RG

## 2019-09-20 NOTE — Telephone Encounter (Signed)
Spoke to patient who is very concerned after reviewing her CT report. She states that she has a strong family history of heath and cardiovascular  Disease. Patient would like a referral to a cardiologist, and is there anything she can do preventatively regarding her results.

## 2019-09-20 NOTE — Telephone Encounter (Signed)
Pt is requesting a call back from a nurse in regards to her CT scan results. Pt states based on the results she would like for Dr Lyndel Safe to refer her to [facility].   Fax: [phone]

## 2019-09-21 ENCOUNTER — Other Ambulatory Visit: Payer: Self-pay | Admitting: Gastroenterology

## 2019-09-21 DIAGNOSIS — R1032 Left lower quadrant pain: Secondary | ICD-10-CM

## 2019-09-21 NOTE — Telephone Encounter (Signed)
Spoke to patient. Referral sent to cardiology. Patient requesting dr April Duffy at [facility] on [address].

## 2019-10-07 ENCOUNTER — Other Ambulatory Visit: Payer: Self-pay

## 2019-10-19 DIAGNOSIS — L219 Seborrheic dermatitis, unspecified: Secondary | ICD-10-CM | POA: Diagnosis not present

## 2019-10-27 DIAGNOSIS — J329 Chronic sinusitis, unspecified: Secondary | ICD-10-CM | POA: Diagnosis not present

## 2019-10-27 DIAGNOSIS — Z20828 Contact with and (suspected) exposure to other viral communicable diseases: Secondary | ICD-10-CM | POA: Diagnosis not present

## 2019-11-10 ENCOUNTER — Ambulatory Visit (INDEPENDENT_AMBULATORY_CARE_PROVIDER_SITE_OTHER): Payer: PPO | Admitting: Cardiology

## 2019-11-10 ENCOUNTER — Encounter: Payer: Self-pay | Admitting: Cardiology

## 2019-11-10 ENCOUNTER — Other Ambulatory Visit: Payer: Self-pay

## 2019-11-10 VITALS — BP 110/60 | HR 71 | Ht 65.0 in | Wt 137.0 lb

## 2019-11-10 DIAGNOSIS — I700 Atherosclerosis of aorta: Secondary | ICD-10-CM

## 2019-11-10 DIAGNOSIS — I712 Thoracic aortic aneurysm, without rupture, unspecified: Secondary | ICD-10-CM

## 2019-11-10 LAB — BASIC METABOLIC PANEL
BUN/Creatinine Ratio: 17 (ref 12–28)
BUN: 15 mg/dL (ref 8–27)
CO2: 26 mmol/L (ref 20–29)
Calcium: 10.2 mg/dL (ref 8.7–10.3)
Chloride: 100 mmol/L (ref 96–106)
Creatinine, Ser: 0.88 mg/dL (ref 0.57–1.00)
GFR calc Af Amer: 79 mL/min/{1.73_m2} (ref >59)
GFR calc non Af Amer: 69 mL/min/{1.73_m2} (ref >59)
Glucose: 77 mg/dL (ref 65–99)
Potassium: 4.5 mmol/L (ref 3.5–5.2)
Sodium: 141 mmol/L (ref 134–144)
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
## 2019-11-10 NOTE — Patient Instructions (Signed)
Medication Instructions:  Your physician recommends that you continue on your current medications as directed. Please refer to the Current Medication list given to you today.  *If you need a refill on your cardiac medications before your next appointment, please call your pharmacy*  Testing/Procedures: Your provider recommends that you have a Chest CT Angiography (CTA).  Your provider recommends that you have a Calcium Score CT Scan.   Follow-Up: At [facility], you and your health needs are our priority.  As part of our continuing mission to provide you with exceptional heart care, we have created designated Provider Care Teams.  These Care Teams include your primary Cardiologist (physician) and Advanced Practice Providers (APPs -  Physician Assistants and Nurse Practitioners) who all work together to provide you with the care you need, when you need it.   Your next appointment:   1 year(s)  The format for your next appointment:   In Person  Provider:   You may see Fransico Him, MD or one of the following Advanced Practice Providers on your designated Care Team:    Melina Copa, PA-C  Ermalinda Barrios, PA-C

## 2019-11-10 NOTE — Addendum Note (Signed)
Addended by: Antonieta Iba on: 11/10/2019 09:09 AM   Modules accepted: Orders

## 2019-11-10 NOTE — Addendum Note (Signed)
Addended by: Antonieta Iba on: 11/10/2019 09:18 AM   Modules accepted: Orders

## 2019-11-17 ENCOUNTER — Telehealth: Payer: Self-pay | Admitting: Cardiology

## 2019-11-17 DIAGNOSIS — F319 Bipolar disorder, unspecified: Secondary | ICD-10-CM | POA: Diagnosis not present

## 2019-11-17 NOTE — Telephone Encounter (Signed)
New Message  Pt is calling and is wondering if there is any dye that she needs to pick up before her CT   Please call

## 2019-11-17 NOTE — Telephone Encounter (Signed)
Spoke with the patient and advised her that there is nothing that she needs to pick up prior to her CT scan. Patient verbalized understanding.

## 2019-11-24 ENCOUNTER — Other Ambulatory Visit: Payer: PPO

## 2019-11-29 ENCOUNTER — Other Ambulatory Visit: Payer: Self-pay

## 2019-11-29 ENCOUNTER — Ambulatory Visit (INDEPENDENT_AMBULATORY_CARE_PROVIDER_SITE_OTHER)
Admission: RE | Admit: 2019-11-29 | Discharge: 2019-11-29 | Disposition: A | Discharge status: Home / Self Care | Payer: PPO | Source: Ambulatory Visit | Attending: Cardiology | Admitting: Cardiology

## 2019-11-29 ENCOUNTER — Ambulatory Visit (INDEPENDENT_AMBULATORY_CARE_PROVIDER_SITE_OTHER)
Admission: RE | Admit: 2019-11-29 | Discharge: 2019-11-29 | Disposition: A | Discharge status: Home / Self Care | Payer: Self-pay | Source: Ambulatory Visit | Attending: Cardiology | Admitting: Cardiology

## 2019-11-29 DIAGNOSIS — Z8679 Personal history of other diseases of the circulatory system: Secondary | ICD-10-CM | POA: Diagnosis not present

## 2019-11-29 DIAGNOSIS — I712 Thoracic aortic aneurysm, without rupture, unspecified: Secondary | ICD-10-CM

## 2019-11-29 DIAGNOSIS — J984 Other disorders of lung: Secondary | ICD-10-CM | POA: Diagnosis not present

## 2019-11-29 DIAGNOSIS — I700 Atherosclerosis of aorta: Secondary | ICD-10-CM

## 2019-11-29 MED ORDER — IOHEXOL 350 MG/ML SOLN
80.0000 mL | Freq: Once | INTRAVENOUS | Status: AC | PRN
  Administered 2019-11-29: 80 mL via INTRAVENOUS

## 2019-12-10 DIAGNOSIS — H35371 Puckering of macula, right eye: Secondary | ICD-10-CM | POA: Diagnosis not present

## 2019-12-10 DIAGNOSIS — H524 Presbyopia: Secondary | ICD-10-CM | POA: Diagnosis not present

## 2019-12-10 DIAGNOSIS — H25013 Cortical age-related cataract, bilateral: Secondary | ICD-10-CM | POA: Diagnosis not present

## 2019-12-10 DIAGNOSIS — H2513 Age-related nuclear cataract, bilateral: Secondary | ICD-10-CM | POA: Diagnosis not present

## 2019-12-23 DIAGNOSIS — H2511 Age-related nuclear cataract, right eye: Secondary | ICD-10-CM | POA: Diagnosis not present

## 2019-12-23 DIAGNOSIS — H2513 Age-related nuclear cataract, bilateral: Secondary | ICD-10-CM | POA: Diagnosis not present

## 2019-12-23 DIAGNOSIS — H25013 Cortical age-related cataract, bilateral: Secondary | ICD-10-CM | POA: Diagnosis not present

## 2020-01-10 ENCOUNTER — Other Ambulatory Visit: Payer: Self-pay

## 2020-01-10 HISTORY — PX: CATARACT EXTRACTION, BILATERAL: SHX1313

## 2020-01-10 NOTE — Telephone Encounter (Signed)
Patient is calling for a refill of Evista.

## 2020-01-10 NOTE — Telephone Encounter (Signed)
Medication refill request: Evista Last AEX:  12-02-2018 BS Next AEX: 04-11-20 Last MMG (if hormonal medication request): n/a Refill authorized: Today, please advise.   Medication pended for #90, 0RF. Please refill if appropriate.

## 2020-01-11 NOTE — Telephone Encounter (Signed)
Please contact patient in follow up to refill request for Evista.   I see on chart review that she has atherosclerosis of the aorta.   I recommend she discontinue the Evista at this time, as this can increase the risk of developing blood clots in the circulatory system.   Her next bone density is due in March, 2022.   Options for care right now are: - referral to endocrinology for further management and potential Prolia treatment. - get bone density in March, 2022 and then make a decision regarding her care plan.

## 2020-01-12 ENCOUNTER — Other Ambulatory Visit: Payer: Self-pay | Admitting: Obstetrics and Gynecology

## 2020-01-12 ENCOUNTER — Ambulatory Visit: Payer: PPO | Admitting: Obstetrics and Gynecology

## 2020-01-12 DIAGNOSIS — H25811 Combined forms of age-related cataract, right eye: Secondary | ICD-10-CM | POA: Diagnosis not present

## 2020-01-12 DIAGNOSIS — H2511 Age-related nuclear cataract, right eye: Secondary | ICD-10-CM | POA: Diagnosis not present

## 2020-01-12 NOTE — Telephone Encounter (Signed)
Spoke with pt. Pt given update and recommendations per Dr Quincy Simmonds. Pt agreeable to stop taking Evista and verbalized understanding. Pt states would like to wait to have next BMD in March 2022 and then make a decision on treatment.  Next AEX 04/11/2020 with Dr Quincy Simmonds  Routing to Dr Quincy Simmonds for review  Encounter closed.

## 2020-01-15 ENCOUNTER — Other Ambulatory Visit: Payer: Self-pay | Admitting: Obstetrics and Gynecology

## 2020-01-18 DIAGNOSIS — H2512 Age-related nuclear cataract, left eye: Secondary | ICD-10-CM | POA: Diagnosis not present

## 2020-01-18 DIAGNOSIS — H25012 Cortical age-related cataract, left eye: Secondary | ICD-10-CM | POA: Diagnosis not present

## 2020-01-26 DIAGNOSIS — H25012 Cortical age-related cataract, left eye: Secondary | ICD-10-CM | POA: Diagnosis not present

## 2020-01-26 DIAGNOSIS — H2512 Age-related nuclear cataract, left eye: Secondary | ICD-10-CM | POA: Diagnosis not present

## 2020-02-23 DIAGNOSIS — F319 Bipolar disorder, unspecified: Secondary | ICD-10-CM | POA: Diagnosis not present

## 2020-03-17 DIAGNOSIS — Z0000 Encounter for general adult medical examination without abnormal findings: Secondary | ICD-10-CM | POA: Diagnosis not present

## 2020-03-17 DIAGNOSIS — Z6822 Body mass index (BMI) 22.0-22.9, adult: Secondary | ICD-10-CM | POA: Diagnosis not present

## 2020-03-17 DIAGNOSIS — Z5181 Encounter for therapeutic drug level monitoring: Secondary | ICD-10-CM | POA: Diagnosis not present

## 2020-04-10 ENCOUNTER — Telehealth: Payer: Self-pay

## 2020-04-10 NOTE — Telephone Encounter (Signed)
AEX appt tomorrow at 3:30pm.  Wants labs and asked if she needs to be fasting. I advised we do not instruct patients to fast as what we do are considered screening labs. If a result comes back abnormal and requires lab follow up they may request she schedule lab appointment to return fasting early one morning to follow up.

## 2020-04-11 ENCOUNTER — Other Ambulatory Visit: Payer: Self-pay | Admitting: Obstetrics and Gynecology

## 2020-04-11 ENCOUNTER — Ambulatory Visit: Payer: PPO | Admitting: Obstetrics and Gynecology

## 2020-04-11 ENCOUNTER — Encounter: Payer: Self-pay | Admitting: Obstetrics and Gynecology

## 2020-04-11 ENCOUNTER — Other Ambulatory Visit: Payer: Self-pay

## 2020-04-11 VITALS — BP 130/76 | HR 88 | Ht 65.0 in | Wt 137.0 lb

## 2020-04-11 DIAGNOSIS — Z01411 Encounter for gynecological examination (general) (routine) with abnormal findings: Secondary | ICD-10-CM

## 2020-04-11 DIAGNOSIS — Z124 Encounter for screening for malignant neoplasm of cervix: Secondary | ICD-10-CM

## 2020-04-11 DIAGNOSIS — Z1239 Encounter for other screening for malignant neoplasm of breast: Secondary | ICD-10-CM | POA: Diagnosis not present

## 2020-04-11 DIAGNOSIS — M810 Age-related osteoporosis without current pathological fracture: Secondary | ICD-10-CM | POA: Diagnosis not present

## 2020-04-11 DIAGNOSIS — Z1231 Encounter for screening mammogram for malignant neoplasm of breast: Secondary | ICD-10-CM
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
## 2020-04-12 ENCOUNTER — Other Ambulatory Visit (HOSPITAL_COMMUNITY)
Admission: RE | Admit: 2020-04-12 | Discharge: 2020-04-12 | Disposition: A | Discharge status: Home / Self Care | Payer: PPO | Source: Ambulatory Visit | Attending: Obstetrics and Gynecology | Admitting: Obstetrics and Gynecology

## 2020-04-12 DIAGNOSIS — Z124 Encounter for screening for malignant neoplasm of cervix: Secondary | ICD-10-CM | POA: Diagnosis not present

## 2020-04-13 NOTE — Patient Instructions (Signed)

## 2020-04-14 LAB — CYTOLOGY - PAP: Diagnosis: NEGATIVE

## 2020-04-18 DIAGNOSIS — M4317 Spondylolisthesis, lumbosacral region: Secondary | ICD-10-CM | POA: Diagnosis not present

## 2020-05-17 DIAGNOSIS — F319 Bipolar disorder, unspecified: Secondary | ICD-10-CM | POA: Diagnosis not present

## 2020-05-29 DIAGNOSIS — D1801 Hemangioma of skin and subcutaneous tissue: Secondary | ICD-10-CM | POA: Diagnosis not present

## 2020-05-29 DIAGNOSIS — L821 Other seborrheic keratosis: Secondary | ICD-10-CM | POA: Diagnosis not present

## 2020-05-29 DIAGNOSIS — D225 Melanocytic nevi of trunk: Secondary | ICD-10-CM | POA: Diagnosis not present

## 2020-05-29 DIAGNOSIS — L218 Other seborrheic dermatitis: Secondary | ICD-10-CM | POA: Diagnosis not present

## 2020-05-29 DIAGNOSIS — L814 Other melanin hyperpigmentation: Secondary | ICD-10-CM | POA: Diagnosis not present

## 2020-05-29 DIAGNOSIS — L738 Other specified follicular disorders: Secondary | ICD-10-CM | POA: Diagnosis not present

## 2020-05-29 DIAGNOSIS — B078 Other viral warts: Secondary | ICD-10-CM | POA: Diagnosis not present

## 2020-07-13 DIAGNOSIS — B370 Candidal stomatitis: Secondary | ICD-10-CM | POA: Diagnosis not present

## 2020-07-13 DIAGNOSIS — Z6822 Body mass index (BMI) 22.0-22.9, adult: Secondary | ICD-10-CM | POA: Diagnosis not present

## 2020-07-13 DIAGNOSIS — Z6820 Body mass index (BMI) 20.0-20.9, adult: Secondary | ICD-10-CM | POA: Diagnosis not present

## 2020-08-16 DIAGNOSIS — F319 Bipolar disorder, unspecified: Secondary | ICD-10-CM | POA: Diagnosis not present

## 2020-08-22 DIAGNOSIS — H35371 Puckering of macula, right eye: Secondary | ICD-10-CM | POA: Diagnosis not present

## 2020-08-22 DIAGNOSIS — H524 Presbyopia: Secondary | ICD-10-CM | POA: Diagnosis not present

## 2020-08-22 DIAGNOSIS — H04123 Dry eye syndrome of bilateral lacrimal glands: Secondary | ICD-10-CM | POA: Diagnosis not present

## 2020-08-22 DIAGNOSIS — Z961 Presence of intraocular lens: Secondary | ICD-10-CM | POA: Diagnosis not present

## 2020-09-01 ENCOUNTER — Ambulatory Visit
Admission: RE | Admit: 2020-09-01 | Discharge: 2020-09-01 | Disposition: A | Discharge status: Home / Self Care | Payer: PPO | Source: Ambulatory Visit | Attending: Obstetrics and Gynecology | Admitting: Obstetrics and Gynecology

## 2020-09-01 ENCOUNTER — Other Ambulatory Visit: Payer: Self-pay

## 2020-09-01 DIAGNOSIS — M8588 Other specified disorders of bone density and structure, other site: Secondary | ICD-10-CM | POA: Diagnosis not present

## 2020-09-01 DIAGNOSIS — Z1231 Encounter for screening mammogram for malignant neoplasm of breast: Secondary | ICD-10-CM

## 2020-09-01 DIAGNOSIS — M810 Age-related osteoporosis without current pathological fracture: Secondary | ICD-10-CM | POA: Diagnosis not present

## 2020-09-01 DIAGNOSIS — Z780 Asymptomatic menopausal state: Secondary | ICD-10-CM | POA: Diagnosis not present

## 2020-09-13 DIAGNOSIS — F319 Bipolar disorder, unspecified: Secondary | ICD-10-CM | POA: Diagnosis not present

## 2020-09-29 DIAGNOSIS — B351 Tinea unguium: Secondary | ICD-10-CM | POA: Diagnosis not present

## 2020-09-29 DIAGNOSIS — Z6821 Body mass index (BMI) 21.0-21.9, adult: Secondary | ICD-10-CM | POA: Diagnosis not present

## 2020-10-16 DIAGNOSIS — M4317 Spondylolisthesis, lumbosacral region: Secondary | ICD-10-CM | POA: Diagnosis not present

## 2020-11-23 DIAGNOSIS — F319 Bipolar disorder, unspecified: Secondary | ICD-10-CM | POA: Diagnosis not present

## 2020-11-30 DIAGNOSIS — L814 Other melanin hyperpigmentation: Secondary | ICD-10-CM | POA: Diagnosis not present

## 2020-11-30 DIAGNOSIS — L570 Actinic keratosis: Secondary | ICD-10-CM | POA: Diagnosis not present

## 2020-11-30 DIAGNOSIS — L821 Other seborrheic keratosis: Secondary | ICD-10-CM | POA: Diagnosis not present

## 2020-11-30 DIAGNOSIS — D485 Neoplasm of uncertain behavior of skin: Secondary | ICD-10-CM | POA: Diagnosis not present

## 2020-11-30 DIAGNOSIS — D225 Melanocytic nevi of trunk: Secondary | ICD-10-CM | POA: Diagnosis not present

## 2020-12-08 ENCOUNTER — Ambulatory Visit: Payer: BC Managed Care – PPO | Admitting: Cardiology

## 2021-01-04 DIAGNOSIS — F319 Bipolar disorder, unspecified: Secondary | ICD-10-CM | POA: Diagnosis not present

## 2021-01-17 IMAGING — CT CT ANGIO CHEST
2 of 8 series · 17 of 46 positions shown · IV contrast (omnipaque)
Comparison: No priors.
COMPARISON: No priors.

Addendum:
CLINICAL DATA: 68-year-old female with history of thoracic aortic
aneurysm. Follow-up study.

EXAM:
CT ANGIOGRAPHY CHEST WITH CONTRAST
TECHNIQUE: Multidetector CT imaging of the chest was performed using the
standard protocol during bolus administration of intravenous
contrast. Multiplanar CT image reconstructions and MIPs were
obtained to evaluate the vascular anatomy.
CONTRAST:  80mL OMNIPAQUE IOHEXOL 350 MG/ML SOLN
CLINICAL DATA: Risk stratification
Coronary Calcium Score
TECHNIQUE: The patient was scanned on a Siemens Force scanner. Axial
non-contrast 3 mm slices were carried out through the heart. The
data set was analyzed on a dedicated work station and scored using
the Agatson method.

[Series 5: thins · axial · 0.65mm/px · z∈[+1322,+1576]mm · 14 of 287 slices shown]
[im 16/287  lung]
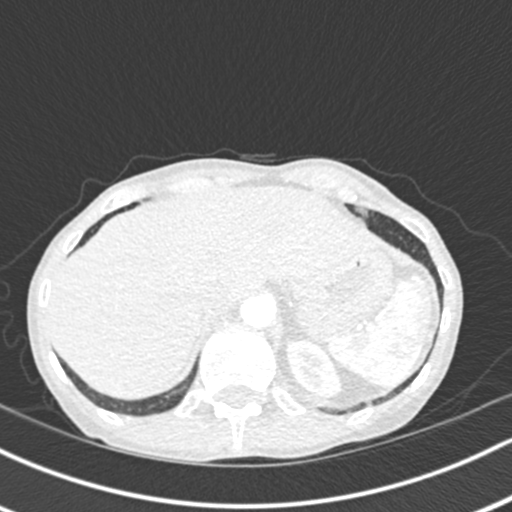
[im 32/287  soft-tissue]
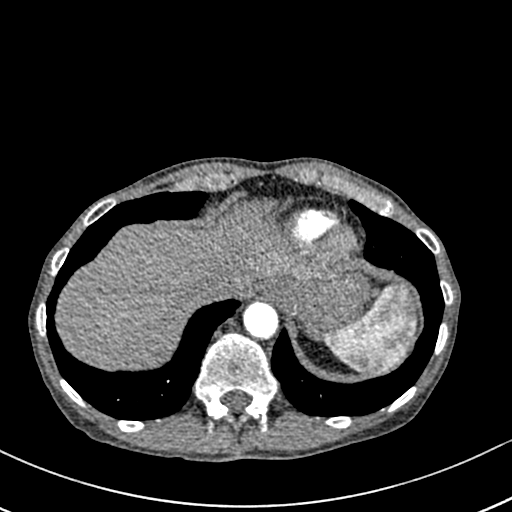
[im 64/287  lung]
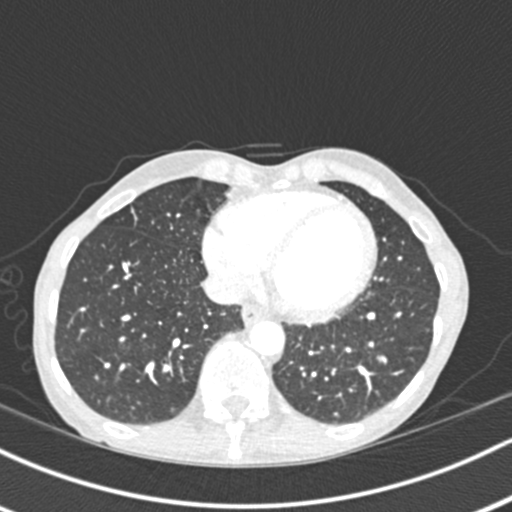
[im 80/287  soft-tissue]
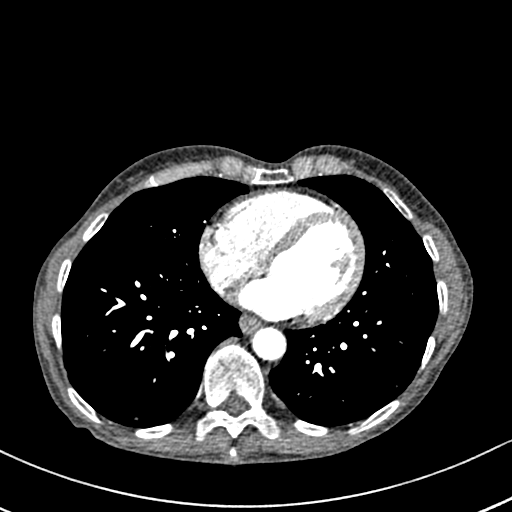
[im 96/287  lung]
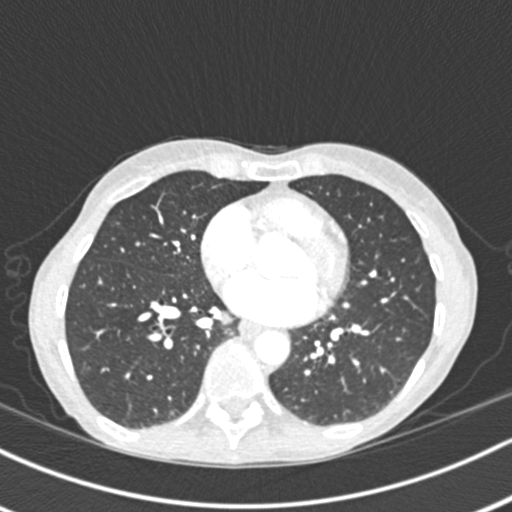
[im 112/287  soft-tissue]
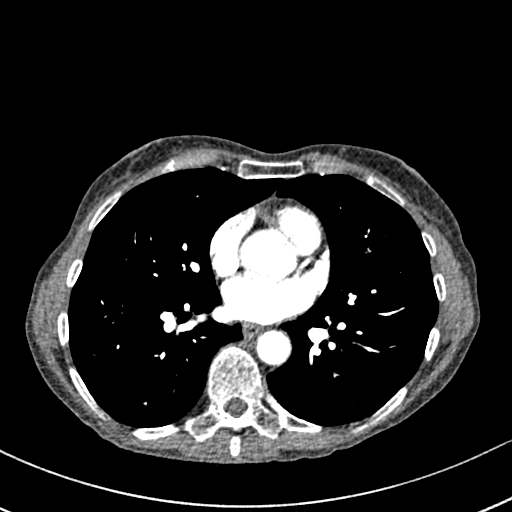
[im 128/287  lung]
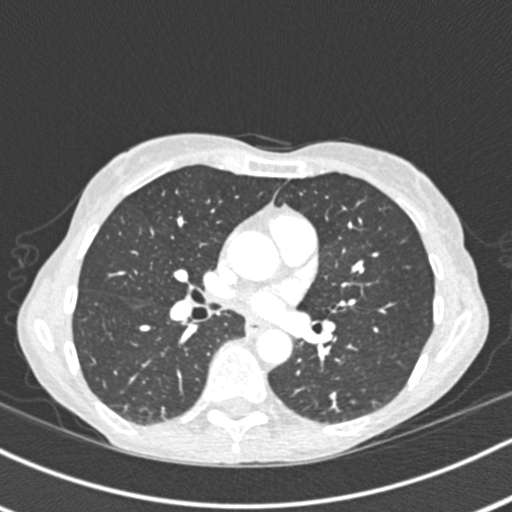
[im 159/287  soft-tissue]
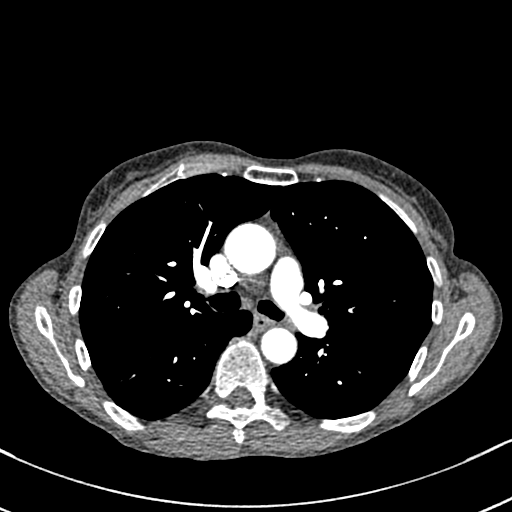
[im 175/287  lung]
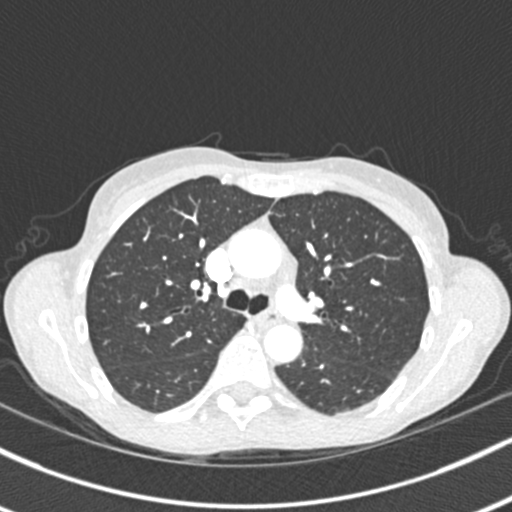
[im 191/287  soft-tissue]
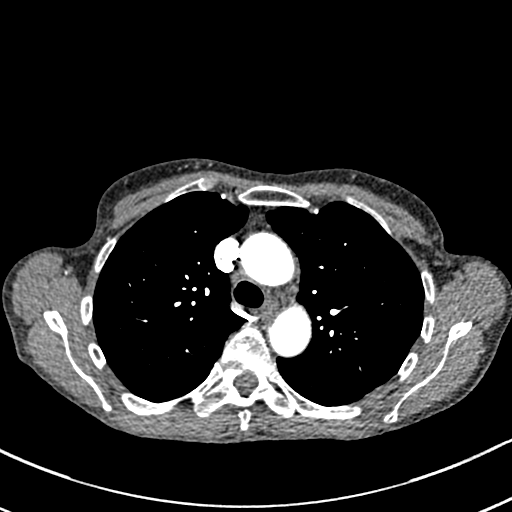
[im 207/287  lung]
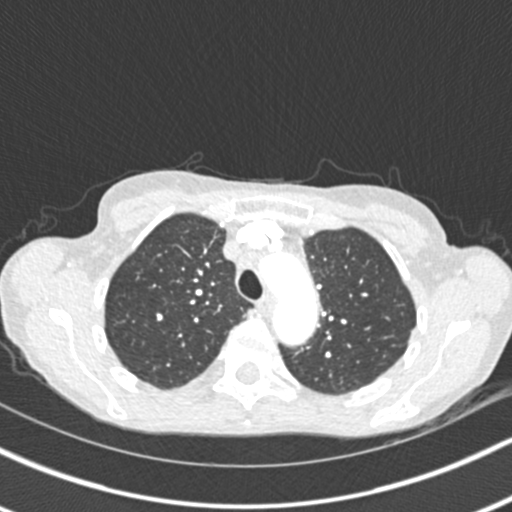
[im 223/287  soft-tissue]
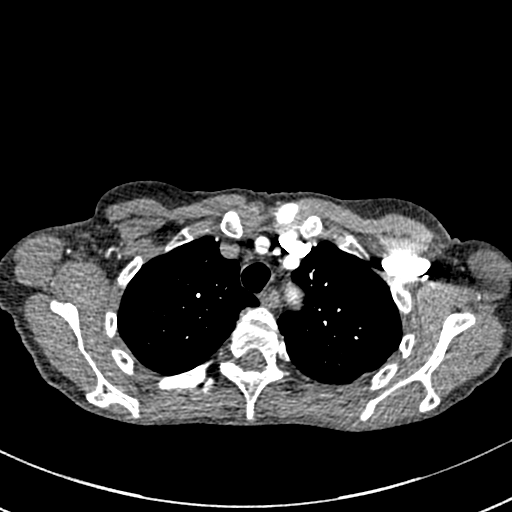
[im 255/287  lung]
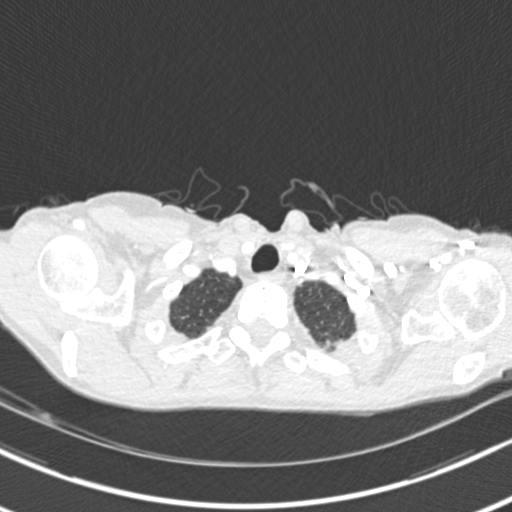
[im 271/287  soft-tissue]
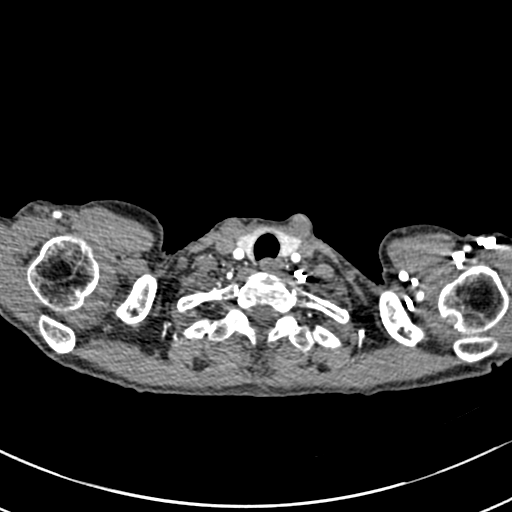

[Series 7: coronal mpr · coronal · 0.59mm/px · 3 of 137 slices shown]
[im 35/137  soft-tissue]
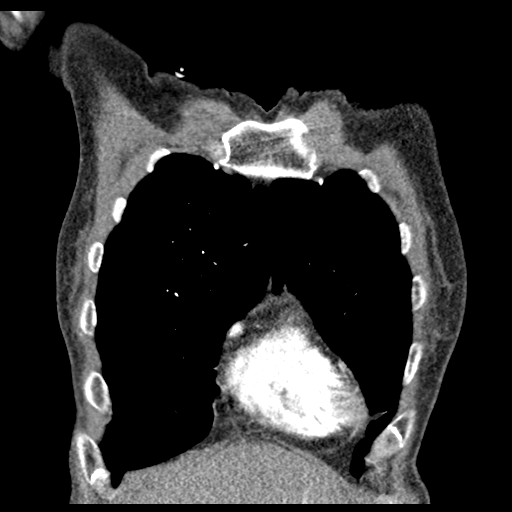
[im 69/137  soft-tissue]
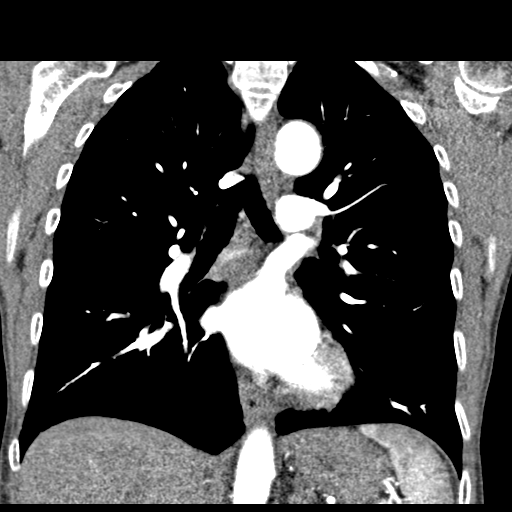
[im 103/137  soft-tissue]
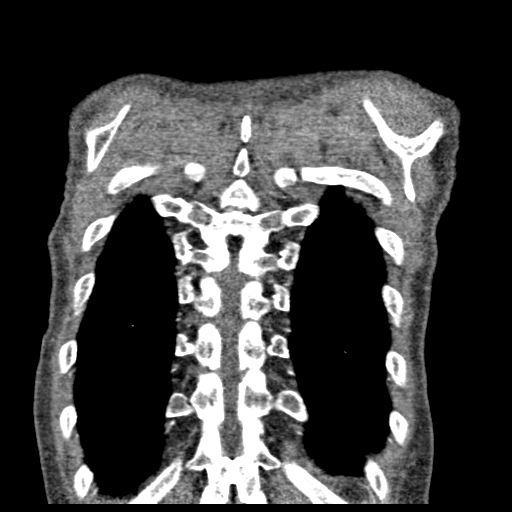

[17 of 46 positions shown; findings below may reference images not displayed]

FINDINGS: Cardiovascular: Heart size is normal. There is no significant
pericardial fluid, thickening or pericardial calcification. No
atherosclerotic calcifications in the thoracic aorta or the coronary
arteries. Ascending thoracic aorta measures 3.2 cm in diameter. Mid
aortic arch measures 2.5 cm in diameter. Descending thoracic aorta
measures 2.1 cm in diameter. No evidence of thoracic aortic
dissection.

Mediastinum/Nodes: No pathologically enlarged mediastinal or hilar
lymph nodes. Esophagus is unremarkable in appearance. No axillary
lymphadenopathy.

Lungs/Pleura: No suspicious appearing pulmonary nodules or masses
are noted. No acute consolidative airspace disease. No pleural
effusions. Mild linear scarring in the periphery of the lateral
segment of the right middle lobe and lateral aspect of the right
lower lobe.

Upper Abdomen: Unremarkable.

Musculoskeletal: There are no aggressive appearing lytic or blastic
lesions noted in the visualized portions of the skeleton.

Review of the MIP images confirms the above findings.
IMPRESSION: 1. No evidence of thoracic aortic aneurysm.
2. No acute findings in the thorax.
FINDINGS: Non-cardiac: See separate report from [REDACTED].

Ascending Aorta: Normal caliber and no calcifications.

Pericardium: Normal

Coronary arteries: Normal coronary origins.
IMPRESSION: Coronary calcium score of 0. This was 0 percentile for age and sex
matched control.

Jagod Arasim

*** End of Addendum ***
FINDINGS: Cardiovascular: Heart size is normal. There is no significant
pericardial fluid, thickening or pericardial calcification. No
atherosclerotic calcifications in the thoracic aorta or the coronary
arteries. Ascending thoracic aorta measures 3.2 cm in diameter. Mid
aortic arch measures 2.5 cm in diameter. Descending thoracic aorta
measures 2.1 cm in diameter. No evidence of thoracic aortic
dissection.

Mediastinum/Nodes: No pathologically enlarged mediastinal or hilar
lymph nodes. Esophagus is unremarkable in appearance. No axillary
lymphadenopathy.

Lungs/Pleura: No suspicious appearing pulmonary nodules or masses
are noted. No acute consolidative airspace disease. No pleural
effusions. Mild linear scarring in the periphery of the lateral
segment of the right middle lobe and lateral aspect of the right
lower lobe.

Upper Abdomen: Unremarkable.

Musculoskeletal: There are no aggressive appearing lytic or blastic
lesions noted in the visualized portions of the skeleton.

Review of the MIP images confirms the above findings.
IMPRESSION: 1. No evidence of thoracic aortic aneurysm.
2. No acute findings in the thorax.

## 2021-02-08 DIAGNOSIS — H6981 Other specified disorders of Eustachian tube, right ear: Secondary | ICD-10-CM | POA: Diagnosis not present

## 2021-02-08 DIAGNOSIS — H938X1 Other specified disorders of right ear: Secondary | ICD-10-CM | POA: Diagnosis not present

## 2021-03-01 DIAGNOSIS — F319 Bipolar disorder, unspecified: Secondary | ICD-10-CM | POA: Diagnosis not present

## 2021-04-05 NOTE — Telephone Encounter (Signed)
Spoke with the patient and she is agreeable to new date/time for appointment with Dr. Radford Pax.

## 2021-04-06 ENCOUNTER — Ambulatory Visit: Payer: BC Managed Care – PPO | Admitting: Cardiology

## 2021-04-10 ENCOUNTER — Encounter: Payer: Self-pay | Admitting: Cardiology

## 2021-04-10 DIAGNOSIS — I700 Atherosclerosis of aorta: Secondary | ICD-10-CM | POA: Insufficient documentation
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
## 2021-04-11 ENCOUNTER — Other Ambulatory Visit: Payer: Self-pay | Admitting: *Deleted

## 2021-04-11 ENCOUNTER — Encounter: Payer: Self-pay | Admitting: Cardiology

## 2021-04-11 ENCOUNTER — Other Ambulatory Visit: Payer: Self-pay

## 2021-04-11 ENCOUNTER — Ambulatory Visit (INDEPENDENT_AMBULATORY_CARE_PROVIDER_SITE_OTHER): Payer: PPO | Admitting: Cardiology

## 2021-04-11 VITALS — BP 126/76 | HR 78 | Ht 65.0 in | Wt 137.6 lb

## 2021-04-11 DIAGNOSIS — I700 Atherosclerosis of aorta: Secondary | ICD-10-CM | POA: Diagnosis not present

## 2021-04-11 LAB — ALT: ALT: 14 IU/L (ref 0–32)

## 2021-04-11 LAB — LIPID PANEL
Chol/HDL Ratio: 1.7 ratio (ref 0.0–4.4)
Cholesterol, Total: 202 mg/dL — ABNORMAL HIGH (ref 100–199)
HDL: 117 mg/dL (ref >39)
LDL Chol Calc (NIH): 74 mg/dL (ref 0–99)
Triglycerides: 58 mg/dL (ref 0–149)
VLDL Cholesterol Cal: 11 mg/dL (ref 5–40)

## 2021-04-11 NOTE — Addendum Note (Signed)
Addended by: Antonieta Iba on: 04/11/2021 08:48 AM   Modules accepted: Orders

## 2021-04-11 NOTE — Patient Instructions (Signed)
Medication Instructions:  Your physician recommends that you continue on your current medications as directed. Please refer to the Current Medication list given to you today.  *If you need a refill on your cardiac medications before your next appointment, please call your pharmacy*  Lab Work: TODAY: FLP and ALT If you have labs (blood work) drawn today and your tests are completely normal, you will receive your results only by: [place] (if you have MyChart) OR A paper copy in the mail If you have any lab test that is abnormal or we need to change your treatment, we will call you to review the results.  Follow-Up: At [facility], you and your health needs are our priority.  As part of our continuing mission to provide you with exceptional heart care, we have created designated Provider Care Teams.  These Care Teams include your primary Cardiologist (physician) and Advanced Practice Providers (APPs -  Physician Assistants and Nurse Practitioners) who all work together to provide you with the care you need, when you need it.  Follow up with Dr. Radford Pax as needed.

## 2021-04-12 DIAGNOSIS — F319 Bipolar disorder, unspecified: Secondary | ICD-10-CM | POA: Diagnosis not present
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
## 2021-04-18 ENCOUNTER — Ambulatory Visit: Payer: PPO | Admitting: Obstetrics and Gynecology

## 2021-04-19 DIAGNOSIS — M4317 Spondylolisthesis, lumbosacral region: Secondary | ICD-10-CM | POA: Diagnosis not present
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
## 2021-05-03 DIAGNOSIS — B351 Tinea unguium: Secondary | ICD-10-CM | POA: Diagnosis not present

## 2021-05-03 DIAGNOSIS — D2372 Other benign neoplasm of skin of left lower limb, including hip: Secondary | ICD-10-CM | POA: Diagnosis not present

## 2021-05-03 DIAGNOSIS — B070 Plantar wart: Secondary | ICD-10-CM | POA: Diagnosis not present

## 2021-05-11 ENCOUNTER — Other Ambulatory Visit (HOSPITAL_COMMUNITY)
Admission: RE | Admit: 2021-05-11 | Discharge: 2021-05-11 | Disposition: A | Discharge status: Home / Self Care | Payer: PPO | Source: Ambulatory Visit | Attending: Obstetrics and Gynecology | Admitting: Obstetrics and Gynecology

## 2021-05-11 ENCOUNTER — Ambulatory Visit (INDEPENDENT_AMBULATORY_CARE_PROVIDER_SITE_OTHER): Payer: PPO | Admitting: Obstetrics and Gynecology

## 2021-05-11 ENCOUNTER — Encounter: Payer: Self-pay | Admitting: Obstetrics and Gynecology

## 2021-05-11 ENCOUNTER — Other Ambulatory Visit: Payer: Self-pay

## 2021-05-11 VITALS — BP 104/82 | HR 77 | Ht 65.5 in | Wt 138.0 lb

## 2021-05-11 DIAGNOSIS — Z9189 Other specified personal risk factors, not elsewhere classified: Secondary | ICD-10-CM

## 2021-05-11 DIAGNOSIS — Z1151 Encounter for screening for human papillomavirus (HPV): Secondary | ICD-10-CM | POA: Diagnosis not present

## 2021-05-11 DIAGNOSIS — M810 Age-related osteoporosis without current pathological fracture: Secondary | ICD-10-CM

## 2021-05-11 DIAGNOSIS — Z01419 Encounter for gynecological examination (general) (routine) without abnormal findings: Secondary | ICD-10-CM | POA: Diagnosis not present

## 2021-05-11 DIAGNOSIS — Z124 Encounter for screening for malignant neoplasm of cervix: Secondary | ICD-10-CM

## 2021-05-11 NOTE — Patient Instructions (Signed)

## 2021-05-15 LAB — CERVICOVAGINAL ANCILLARY ONLY
Comment: NEGATIVE
High risk HPV: NEGATIVE

## 2021-05-18 DIAGNOSIS — D2372 Other benign neoplasm of skin of left lower limb, including hip: Secondary | ICD-10-CM | POA: Diagnosis not present

## 2021-05-18 DIAGNOSIS — B351 Tinea unguium: Secondary | ICD-10-CM | POA: Diagnosis not present

## 2021-05-18 DIAGNOSIS — B070 Plantar wart: Secondary | ICD-10-CM | POA: Diagnosis not present

## 2021-05-21 DIAGNOSIS — M1812 Unilateral primary osteoarthritis of first carpometacarpal joint, left hand: Secondary | ICD-10-CM | POA: Diagnosis not present

## 2021-05-21 DIAGNOSIS — M79642 Pain in left hand: Secondary | ICD-10-CM | POA: Diagnosis not present

## 2021-05-21 DIAGNOSIS — M654 Radial styloid tenosynovitis [de Quervain]: Secondary | ICD-10-CM | POA: Diagnosis not present

## 2021-05-24 DIAGNOSIS — F319 Bipolar disorder, unspecified: Secondary | ICD-10-CM | POA: Diagnosis not present

## 2021-05-31 DIAGNOSIS — B070 Plantar wart: Secondary | ICD-10-CM | POA: Diagnosis not present

## 2021-05-31 DIAGNOSIS — L570 Actinic keratosis: Secondary | ICD-10-CM | POA: Diagnosis not present

## 2021-05-31 DIAGNOSIS — D225 Melanocytic nevi of trunk: Secondary | ICD-10-CM | POA: Diagnosis not present

## 2021-05-31 DIAGNOSIS — L738 Other specified follicular disorders: Secondary | ICD-10-CM | POA: Diagnosis not present

## 2021-05-31 DIAGNOSIS — L821 Other seborrheic keratosis: Secondary | ICD-10-CM | POA: Diagnosis not present

## 2021-05-31 DIAGNOSIS — L720 Epidermal cyst: Secondary | ICD-10-CM | POA: Diagnosis not present

## 2021-05-31 DIAGNOSIS — L309 Dermatitis, unspecified: Secondary | ICD-10-CM | POA: Diagnosis not present

## 2021-06-01 DIAGNOSIS — B070 Plantar wart: Secondary | ICD-10-CM | POA: Diagnosis not present

## 2021-06-01 DIAGNOSIS — D2372 Other benign neoplasm of skin of left lower limb, including hip: Secondary | ICD-10-CM | POA: Diagnosis not present

## 2021-06-13 DIAGNOSIS — B070 Plantar wart: Secondary | ICD-10-CM | POA: Diagnosis not present

## 2021-06-13 DIAGNOSIS — D2372 Other benign neoplasm of skin of left lower limb, including hip: Secondary | ICD-10-CM | POA: Diagnosis not present

## 2021-06-25 DIAGNOSIS — M654 Radial styloid tenosynovitis [de Quervain]: Secondary | ICD-10-CM | POA: Diagnosis not present

## 2021-06-27 DIAGNOSIS — D2372 Other benign neoplasm of skin of left lower limb, including hip: Secondary | ICD-10-CM | POA: Diagnosis not present

## 2021-06-27 DIAGNOSIS — B070 Plantar wart: Secondary | ICD-10-CM | POA: Diagnosis not present

## 2021-07-05 DIAGNOSIS — F319 Bipolar disorder, unspecified: Secondary | ICD-10-CM | POA: Diagnosis not present

## 2021-07-12 DIAGNOSIS — L400 Psoriasis vulgaris: Secondary | ICD-10-CM | POA: Diagnosis not present

## 2021-07-12 DIAGNOSIS — L218 Other seborrheic dermatitis: Secondary | ICD-10-CM | POA: Diagnosis not present

## 2021-07-18 DIAGNOSIS — B070 Plantar wart: Secondary | ICD-10-CM | POA: Diagnosis not present

## 2021-07-18 DIAGNOSIS — D2372 Other benign neoplasm of skin of left lower limb, including hip: Secondary | ICD-10-CM | POA: Diagnosis not present

## 2021-08-09 DIAGNOSIS — F319 Bipolar disorder, unspecified: Secondary | ICD-10-CM | POA: Diagnosis not present

## 2021-08-10 DIAGNOSIS — M654 Radial styloid tenosynovitis [de Quervain]: Secondary | ICD-10-CM | POA: Diagnosis not present

## 2021-08-17 ENCOUNTER — Other Ambulatory Visit: Payer: Self-pay | Admitting: Obstetrics and Gynecology

## 2021-08-17 DIAGNOSIS — Z1231 Encounter for screening mammogram for malignant neoplasm of breast: Secondary | ICD-10-CM

## 2021-08-21 DIAGNOSIS — B070 Plantar wart: Secondary | ICD-10-CM | POA: Diagnosis not present

## 2021-08-21 DIAGNOSIS — B351 Tinea unguium: Secondary | ICD-10-CM | POA: Diagnosis not present

## 2021-08-21 DIAGNOSIS — D2372 Other benign neoplasm of skin of left lower limb, including hip: Secondary | ICD-10-CM | POA: Diagnosis not present

## 2021-08-23 DIAGNOSIS — H524 Presbyopia: Secondary | ICD-10-CM | POA: Diagnosis not present

## 2021-08-23 DIAGNOSIS — H35371 Puckering of macula, right eye: Secondary | ICD-10-CM | POA: Diagnosis not present

## 2021-08-23 DIAGNOSIS — Z961 Presence of intraocular lens: Secondary | ICD-10-CM | POA: Diagnosis not present

## 2021-08-23 DIAGNOSIS — H04123 Dry eye syndrome of bilateral lacrimal glands: Secondary | ICD-10-CM | POA: Diagnosis not present

## 2021-09-03 ENCOUNTER — Ambulatory Visit
Admission: RE | Admit: 2021-09-03 | Discharge: 2021-09-03 | Disposition: A | Discharge status: Home / Self Care | Payer: PPO | Source: Ambulatory Visit | Attending: Obstetrics and Gynecology | Admitting: Obstetrics and Gynecology

## 2021-09-03 ENCOUNTER — Ambulatory Visit: Payer: BC Managed Care – PPO

## 2021-09-03 DIAGNOSIS — Z1231 Encounter for screening mammogram for malignant neoplasm of breast: Secondary | ICD-10-CM

## 2021-09-04 DIAGNOSIS — B351 Tinea unguium: Secondary | ICD-10-CM | POA: Diagnosis not present

## 2021-09-04 DIAGNOSIS — B070 Plantar wart: Secondary | ICD-10-CM | POA: Diagnosis not present

## 2021-09-04 DIAGNOSIS — D2372 Other benign neoplasm of skin of left lower limb, including hip: Secondary | ICD-10-CM | POA: Diagnosis not present

## 2021-09-24 DIAGNOSIS — D2372 Other benign neoplasm of skin of left lower limb, including hip: Secondary | ICD-10-CM | POA: Diagnosis not present

## 2021-09-24 DIAGNOSIS — B351 Tinea unguium: Secondary | ICD-10-CM | POA: Diagnosis not present

## 2021-09-24 DIAGNOSIS — B070 Plantar wart: Secondary | ICD-10-CM | POA: Diagnosis not present

## 2021-09-28 DIAGNOSIS — D3132 Benign neoplasm of left choroid: Secondary | ICD-10-CM | POA: Diagnosis not present

## 2021-09-28 DIAGNOSIS — H35351 Cystoid macular degeneration, right eye: Secondary | ICD-10-CM | POA: Diagnosis not present

## 2021-10-11 DIAGNOSIS — D2372 Other benign neoplasm of skin of left lower limb, including hip: Secondary | ICD-10-CM | POA: Diagnosis not present

## 2021-10-11 DIAGNOSIS — B351 Tinea unguium: Secondary | ICD-10-CM | POA: Diagnosis not present

## 2021-10-11 DIAGNOSIS — B070 Plantar wart: Secondary | ICD-10-CM | POA: Diagnosis not present

## 2021-10-12 DIAGNOSIS — L218 Other seborrheic dermatitis: Secondary | ICD-10-CM | POA: Diagnosis not present

## 2021-10-12 DIAGNOSIS — L821 Other seborrheic keratosis: Secondary | ICD-10-CM | POA: Diagnosis not present

## 2021-10-12 DIAGNOSIS — L309 Dermatitis, unspecified: Secondary | ICD-10-CM | POA: Diagnosis not present

## 2021-10-17 DIAGNOSIS — F319 Bipolar disorder, unspecified: Secondary | ICD-10-CM | POA: Diagnosis not present

## 2021-11-06 DIAGNOSIS — M4317 Spondylolisthesis, lumbosacral region: Secondary | ICD-10-CM | POA: Diagnosis not present

## 2021-12-26 DIAGNOSIS — F319 Bipolar disorder, unspecified: Secondary | ICD-10-CM | POA: Diagnosis not present

## 2022-02-18 DIAGNOSIS — R052 Subacute cough: Secondary | ICD-10-CM | POA: Diagnosis not present

## 2022-02-18 DIAGNOSIS — J0100 Acute maxillary sinusitis, unspecified: Secondary | ICD-10-CM | POA: Diagnosis not present

## 2022-03-13 DIAGNOSIS — D2372 Other benign neoplasm of skin of left lower limb, including hip: Secondary | ICD-10-CM | POA: Diagnosis not present

## 2022-03-13 DIAGNOSIS — B070 Plantar wart: Secondary | ICD-10-CM | POA: Diagnosis not present

## 2022-03-13 DIAGNOSIS — B351 Tinea unguium: Secondary | ICD-10-CM | POA: Diagnosis not present

## 2022-03-15 DIAGNOSIS — J302 Other seasonal allergic rhinitis: Secondary | ICD-10-CM | POA: Diagnosis not present

## 2022-03-15 DIAGNOSIS — J40 Bronchitis, not specified as acute or chronic: Secondary | ICD-10-CM | POA: Diagnosis not present

## 2022-03-15 DIAGNOSIS — J329 Chronic sinusitis, unspecified: Secondary | ICD-10-CM | POA: Diagnosis not present

## 2022-03-27 DIAGNOSIS — F319 Bipolar disorder, unspecified: Secondary | ICD-10-CM | POA: Diagnosis not present

## 2022-04-05 DIAGNOSIS — H353111 Nonexudative age-related macular degeneration, right eye, early dry stage: Secondary | ICD-10-CM | POA: Diagnosis not present

## 2022-04-05 DIAGNOSIS — D3132 Benign neoplasm of left choroid: Secondary | ICD-10-CM | POA: Diagnosis not present

## 2022-04-11 ENCOUNTER — Telehealth: Payer: Self-pay

## 2022-04-11 NOTE — Telephone Encounter (Signed)
Claudia called patient back to advise.   Lorel Monaco, RMA Left message for patient to call and schedule appointment.

## 2022-04-11 NOTE — Telephone Encounter (Signed)
I recommend an office visit and not waiting until April.

## 2022-04-11 NOTE — Telephone Encounter (Signed)
72 year old. Last AEX  05/11/21. Scheduled for 06/24/2022.  Patient reports that for a couple of months she has a slight discharge off and on. Not sexually active.  No itching, no odor but yellowish in color.Just notices with wiping occasionally. Does not have to wear a pad and it is not every day.  She said she just wondered if that was normal at her age. It is not bothering her and she is fine to wait until her April AEX if Dr. Quincy Simmonds thinks it is okay.
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
## 2022-04-15 NOTE — Telephone Encounter (Signed)
Patient is scheduled for 04/17/22.

## 2022-04-17 ENCOUNTER — Encounter: Payer: Self-pay | Admitting: Obstetrics and Gynecology

## 2022-04-17 ENCOUNTER — Ambulatory Visit (INDEPENDENT_AMBULATORY_CARE_PROVIDER_SITE_OTHER): Payer: PPO | Admitting: Obstetrics and Gynecology

## 2022-04-17 VITALS — BP 126/78 | Ht 65.0 in | Wt 134.0 lb

## 2022-04-17 DIAGNOSIS — N952 Postmenopausal atrophic vaginitis: Secondary | ICD-10-CM

## 2022-04-17 DIAGNOSIS — N760 Acute vaginitis: Secondary | ICD-10-CM | POA: Diagnosis not present

## 2022-04-17 LAB — WET PREP FOR TRICH, YEAST, CLUE

## 2022-04-17 NOTE — Patient Instructions (Signed)
Atrophic Vaginitis  Atrophic vaginitis is a condition in which the tissues that line the vagina become dry and thin. This condition is most common in women who have stopped having regular menstrual periods (are in menopause). This usually starts when a woman is 45 to 70 years old. That is the time when a woman's estrogen levels begin to decrease. Estrogen is a female hormone. It helps to keep the tissues of the vagina moist. It stimulates the vagina to produce a clear fluid that lubricates the vagina for sex. This fluid also protects the vagina from infection. Lack of estrogen can cause the lining of the vagina to get thinner and dryer. The vagina may also shrink in size. It may become less elastic. Atrophic vaginitis tends to get worse over time as a woman's estrogen level drops. What are the causes? This condition is caused by the normal drop in estrogen that happens around the time of menopause. What increases the risk? Certain conditions or situations may lower a woman's estrogen level, leading to a higher risk for atrophic vaginitis. You are more likely to develop this condition if: You are taking medicines that block estrogen. You have had your ovaries removed. You are being treated for cancer with radiation or medicines (chemotherapy). You have given birth or are breastfeeding. You are older than age 50. You smoke. What are the signs or symptoms? Symptoms of this condition include: Pain, soreness, a feeling of pressure, or bleeding during sex (dyspareunia). Vaginal burning, irritation, or itching. Pain or bleeding when a speculum is used in a vaginal exam. Having burning pain while urinating. Vaginal discharge. In some cases, there are no symptoms. How is this diagnosed? This condition is diagnosed based on your medical history and a physical exam. This will include a pelvic exam that checks the vaginal tissues. Though rare, you may also have other tests, including: A urine test. A  test that checks the acid balance in your vagina (acid balance test). How is this treated? Treatment for this condition depends on how severe your symptoms are. Treatment may include: Using an over-the-counter vaginal lubricant before sex. Using a long-acting vaginal moisturizer. Using low-dose estrogen for moderate to severe symptoms that do not respond to other treatments. Options include creams, tablets, and inserts (vaginal rings). Before you use a vaginal estrogen, tell your health care provider if you have a history of: Breast cancer. Endometrial cancer. Blood clots. If you are not sexually active and your symptoms are very mild, you may not need treatment. Follow these instructions at home: Medicines Take over-the-counter and prescription medicines only as told by your health care provider. Do not use herbal or alternative medicines unless your health care provider says that you can. Use over-the-counter creams, lubricants, or moisturizers for dryness only as told by your health care provider. General instructions If your atrophic vaginitis is caused by menopause, discuss all of your menopause symptoms and treatment options with your health care provider. Do not douche. Do not use products that can make your vagina dry. These include: Scented feminine sprays. Scented tampons. Scented soaps. Vaginal sex can help to improve blood flow and elasticity of vaginal tissue. If you choose to have sex and it hurts, try using a water-soluble lubricant or moisturizer right before having sex. Contact a health care provider if: Your discharge looks different than normal. Your vagina has an unusual smell. You have new symptoms. Your symptoms do not improve with treatment. Your symptoms get worse. Summary Atrophic vaginitis is a condition   in which the tissues that line the vagina become dry and thin. It is most common in women who have stopped having regular menstrual periods (are in  menopause). Treatment options include using vaginal lubricants and low-dose vaginal estrogen. Contact a health care provider if your vagina has an unusual smell, or if your symptoms get worse or do not improve after treatment. This information is not intended to replace advice given to you by your health care provider. Make sure you discuss any questions you have with your health care provider. Document Revised: 08/26/2019 Document Reviewed: 08/26/2019 Elsevier Patient Education  2023 [employer]  

## 2022-05-06 DIAGNOSIS — M4317 Spondylolisthesis, lumbosacral region: Secondary | ICD-10-CM | POA: Diagnosis not present

## 2022-05-10 ENCOUNTER — Other Ambulatory Visit: Payer: Self-pay | Admitting: Obstetrics and Gynecology

## 2022-05-10 DIAGNOSIS — M810 Age-related osteoporosis without current pathological fracture: Secondary | ICD-10-CM

## 2022-05-30 DIAGNOSIS — R040 Epistaxis: Secondary | ICD-10-CM | POA: Diagnosis not present
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
## 2022-06-19 DIAGNOSIS — F319 Bipolar disorder, unspecified: Secondary | ICD-10-CM | POA: Diagnosis not present

## 2022-06-24 ENCOUNTER — Ambulatory Visit (INDEPENDENT_AMBULATORY_CARE_PROVIDER_SITE_OTHER): Payer: PPO | Admitting: Obstetrics and Gynecology

## 2022-06-24 ENCOUNTER — Encounter: Payer: Self-pay | Admitting: Obstetrics and Gynecology

## 2022-06-24 VITALS — BP 118/78 | HR 59 | Ht 64.5 in | Wt 135.0 lb

## 2022-06-24 DIAGNOSIS — M810 Age-related osteoporosis without current pathological fracture: Secondary | ICD-10-CM | POA: Diagnosis not present

## 2022-06-24 DIAGNOSIS — Z23 Encounter for immunization: Secondary | ICD-10-CM | POA: Diagnosis not present

## 2022-06-24 DIAGNOSIS — N952 Postmenopausal atrophic vaginitis: Secondary | ICD-10-CM

## 2022-06-24 NOTE — Patient Instructions (Addendum)
Romosozumab Injection What is this medication? ROMOSOZUMAB (roe moe SOZ ue mab) prevents and treats osteoporosis. It works by Paramedic stronger and less likely to break (fracture). It is a monoclonal antibody. This medicine may be used for other purposes; ask your health care provider or pharmacist if you have questions. COMMON BRAND NAME(S): EVENITY What should I tell my care team before I take this medication? They need to know if you have any of these conditions: Dental disease Heart attack Heart disease Kidney problems Low levels of calcium in the blood On dialysis Stroke Wear dentures An unusual or allergic reaction to romosozumab, other medications, foods, dyes or preservatives Pregnant or trying to get pregnant Breast-feeding How should I use this medication? This medication is injected under the skin. It is given by your care team in a hospital or clinic setting. A special MedGuide will be given to you by the pharmacist with each prescription and refill. Be sure to read this information carefully each time. Talk to your care team about the use of this medication in children. Special care may be needed. Overdosage: If you think you have taken too much of this medicine contact a poison control center or emergency room at once. NOTE: This medicine is only for you. Do not share this medicine with others. What if I miss a dose? Keep appointments for follow-up doses. It is important not to miss your dose. Call your care team if you are unable to keep an appointment. What may interact with this medication? Interactions are not expected. This list may not describe all possible interactions. Give your health care provider a list of all the medicines, herbs, non-prescription drugs, or dietary supplements you use. Also tell them if you smoke, drink alcohol, or use illegal drugs. Some items may interact with your medicine. What should I watch for while using this medication? Your  condition will be monitored carefully while you are receiving this medication. You may need bloodwork while taking this medication. You should make sure you get enough calcium and vitamin D while you are taking this medication. Discuss the foods you eat and the vitamins you take with your care team. Some people who take this medication have severe bone, joint, or muscle pain. This medication may also increase your risk for jaw problems or a broken thigh bone. Tell your care team right away if you have severe pain in your jaw, bones, joints, or muscles. Tell you care team if you have any pain that does not go away or that gets worse. Tell your dentist and dental surgeon that you are taking this medication. You should not have major dental surgery while on this medication. See your dentist to have a dental exam and fix any dental problems before starting this medication. Take good care of your teeth while on this medication. Make sure you see your dentist for regular follow-up appointments. What side effects may I notice from receiving this medication? Side effects that you should report to your care team as soon as possible: Allergic reactions or angioedema--skin rash, itching or hives, swelling of the face, eyes, lips, tongue, arms, or legs, trouble swallowing or breathing Heart attack--pain or tightness in the chest, shoulders, arms, or jaw, nausea, shortness of breath, cold or clammy skin, feeling faint or lightheaded Low calcium level--muscle pain or cramps, confusion, tingling, or numbness in the hands or feet Osteonecrosis of the jaw--pain, swelling, or redness in the mouth, numbness of the jaw, poor healing after dental work,  unusual discharge from the mouth, visible bones in the mouth Severe bone, joint, or muscle pain Stroke--sudden numbness or weakness of the face, arm, or leg, trouble speaking, confusion, trouble walking, loss of balance or coordination, dizziness, severe headache, change in  vision Side effects that usually do not require medical attention (report to your care team if they continue or are bothersome): Headache Joint pain Muscle spasms Pain, redness, or irritation at injection site Swelling of the ankles, hands, or feet This list may not describe all possible side effects. Call your doctor for medical advice about side effects. You may report side effects to FDA at 1-800-FDA-1088. Where should I keep my medication? This medication is given in a hospital or clinic. It will not be stored at home. NOTE: This sheet is a summary. It may not cover all possible information. If you have questions about this medicine, talk to your doctor, pharmacist, or health care provider.  2023 Elsevier/Gold Standard (2021-03-28 00:00:00)   Zoledronic Acid Injection (Bone Disorders) What is this medication? ZOLEDRONIC ACID (ZOE le dron ik AS id) prevents and treats osteoporosis. It may also be used to treat Paget's disease of the bone. It works by Interior and spatial designer stronger and less likely to break (fracture). It belongs to a group of medications called bisphosphonates. This medicine may be used for other purposes; ask your health care provider or pharmacist if you have questions. COMMON BRAND NAME(S): Reclast What should I tell my care team before I take this medication? They need to know if you have any of these conditions: Bleeding disorder Cancer Dental disease Kidney disease Low levels of calcium in the blood Low red blood cell counts Lung or breathing disease, such as asthma Receiving steroids, such as dexamethasone or prednisone An unusual or allergic reaction to zoledronic acid, other medications, foods, dyes, or preservatives Pregnant or trying to get pregnant Breast-feeding How should I use this medication? This medication is injected into a vein. It is given by your care team in a hospital or clinic setting. A special MedGuide will be given to you before each  treatment. Be sure to read this information carefully each time. Talk to your care team about the use of this medication in children. Special care may be needed. Overdosage: If you think you have taken too much of this medicine contact a poison control center or emergency room at once. NOTE: This medicine is only for you. Do not share this medicine with others. What if I miss a dose? Keep appointments for follow-up doses. It is important not to miss your dose. Call your care team if you are unable to keep an appointment. What may interact with this medication? Certain antibiotics given by injection Medications for pain and inflammation, such as ibuprofen, naproxen, NSAIDs Some diuretics, such as bumetanide, furosemide Teriparatide This list may not describe all possible interactions. Give your health care provider a list of all the medicines, herbs, non-prescription drugs, or dietary supplements you use. Also tell them if you smoke, drink alcohol, or use illegal drugs. Some items may interact with your medicine. What should I watch for while using this medication? Visit your care team for regular checks on your progress. It may be some time before you see the benefit from this medication. Some people who take this medication have severe bone, joint, or muscle pain. This medication may also increase your risk for jaw problems or a broken thigh bone. Tell your care team right away if you have severe pain  in your jaw, bones, joints, or muscles. Tell your care team if you have any pain that does not go away or that gets worse. You should make sure you get enough calcium and vitamin D while you are taking this medication. Discuss the foods you eat and the vitamins you take with your care team. You may need bloodwork while taking this medication. Tell your dentist and dental surgeon that you are taking this medication. You should not have major dental surgery while on this medication. See your dentist to  have a dental exam and fix any dental problems before starting this medication. Take good care of your teeth while on this medication. Make sure you see your dentist for regular follow-up appointments. What side effects may I notice from receiving this medication? Side effects that you should report to your care team as soon as possible: Allergic reactions--skin rash, itching, hives, swelling of the face, lips, tongue, or throat Kidney injury--decrease in the amount of urine, swelling of the ankles, hands, or feet Low calcium level--muscle pain or cramps, confusion, tingling, or numbness in the hands or feet Osteonecrosis of the jaw--pain, swelling, or redness in the mouth, numbness of the jaw, poor healing after dental work, unusual discharge from the mouth, visible bones in the mouth Severe bone, joint, or muscle pain Side effects that usually do not require medical attention (report to your care team if they continue or are bothersome): Diarrhea Dizziness Headache Nausea Stomach pain Vomiting This list may not describe all possible side effects. Call your doctor for medical advice about side effects. You may report side effects to FDA at 1-800-FDA-1088. Where should I keep my medication? This medication is given in a hospital or clinic. It will not be stored at home. NOTE: This sheet is a summary. It may not cover all possible information. If you have questions about this medicine, talk to your doctor, pharmacist, or health care provider.  2023 Elsevier/Gold Standard (2021-04-13 00:00:00)   Denosumab Injection (Osteoporosis) What is this medication? DENOSUMAB (den oh SUE mab) prevents and treats osteoporosis. It works by Interior and spatial designer stronger and less likely to break (fracture). It is a monoclonal antibody. This medicine may be used for other purposes; ask your health care provider or pharmacist if you have questions. COMMON BRAND NAME(S): Prolia What should I tell my care team  before I take this medication? They need to know if you have any of these conditions: Dental or gum disease, or plan to have dental surgery or a tooth pulled Infection Kidney disease Low levels of calcium or vitamin D in your blood On dialysis Poor nutrition Skin conditions Thyroid disease, or have had thyroid or parathyroid surgery Trouble absorbing minerals in your stomach or intestine An unusual or allergic reaction to denosumab, other medications, foods, dyes, or preservatives Pregnant or trying to get pregnant Breastfeeding How should I use this medication? This medication is injected under the skin. It is given by your care team in a hospital or clinic setting. A special MedGuide will be given to you before each treatment. Be sure to read this information carefully each time. Talk to your care team about the use of this medication in children. Special care may be needed. Overdosage: If you think you have taken too much of this medicine contact a poison control center or emergency room at once. NOTE: This medicine is only for you. Do not share this medicine with others. What if I miss a dose? Keep appointments for follow-up  doses. It is important not to miss your dose. Call your care team if you are unable to keep an appointment. What may interact with this medication? Do not take this medication with any of the following: Other medications that contain denosumab This medication may also interact with the following: Medications that lower your chance of fighting infection Steroid medications, such as prednisone or cortisone This list may not describe all possible interactions. Give your health care provider a list of all the medicines, herbs, non-prescription drugs, or dietary supplements you use. Also tell them if you smoke, drink alcohol, or use illegal drugs. Some items may interact with your medicine. What should I watch for while using this medication? Your condition will be  monitored carefully while you are receiving this medication. You may need blood work while taking this medication. This medication may increase your risk of getting an infection. Call your care team for advice if you get a fever, chills, sore throat, or other symptoms of a cold or flu. Do not treat yourself. Try to avoid being around people who are sick. Tell your dentist and dental surgeon that you are taking this medication. You should not have major dental surgery while on this medication. See your dentist to have a dental exam and fix any dental problems before starting this medication. Take good care of your teeth while on this medication. Make sure you see your dentist for regular follow-up appointments. You should make sure you get enough calcium and vitamin D while you are taking this medication. Discuss the foods you eat and the vitamins you take with your care team. Talk to your care team if you are pregnant or think you might be pregnant. This medication can cause serious birth defects if taken during pregnancy and for 5 months after the last dose. You will need a negative pregnancy test before starting this medication. Contraception is recommended while taking this medication and for 5 months after the last dose. Your care team can help you find the option that works for you. Talk to your care team before breastfeeding. Changes to your treatment plan may be needed. What side effects may I notice from receiving this medication? Side effects that you should report to your care team as soon as possible: Allergic reactions--skin rash, itching, hives, swelling of the face, lips, tongue, or throat Infection--fever, chills, cough, sore throat, wounds that don't heal, pain or trouble when passing urine, general feeling of discomfort or being unwell Low calcium level--muscle pain or cramps, confusion, tingling, or numbness in the hands or feet Osteonecrosis of the jaw--pain, swelling, or redness in the  mouth, numbness of the jaw, poor healing after dental work, unusual discharge from the mouth, visible bones in the mouth Severe bone, joint, or muscle pain Skin infection--skin redness, swelling, warmth, or pain Side effects that usually do not require medical attention (report these to your care team if they continue or are bothersome): Back pain Headache Joint pain Muscle pain Pain in the hands, arms, legs, or feet Runny or stuffy nose Sore throat This list may not describe all possible side effects. Call your doctor for medical advice about side effects. You may report side effects to FDA at 1-800-FDA-1088. Where should I keep my medication? This medication is given in a hospital or clinic. It will not be stored at home. NOTE: This sheet is a summary. It may not cover all possible information. If you have questions about this medicine, talk to your doctor, pharmacist, or  health care provider.  2023 Elsevier/Gold Standard (2021-07-09 00:00:00)

## 2022-06-25 LAB — PHOSPHORUS: Phosphorus: 4.1 mg/dL (ref 2.1–4.3)

## 2022-06-25 LAB — PARATHYROID HORMONE, INTACT (NO CA): PTH: 39 pg/mL (ref 16–77)

## 2022-06-25 LAB — BASIC METABOLIC PANEL
BUN: 15 mg/dL (ref 7–25)
CO2: 26 mmol/L (ref 20–32)
Calcium: 9.4 mg/dL (ref 8.6–10.4)
Chloride: 103 mmol/L (ref 98–110)
Creat: 0.86 mg/dL (ref 0.50–1.05)
Glucose, Bld: 80 mg/dL (ref 65–99)
Potassium: 4 mmol/L (ref 3.5–5.3)
Sodium: 141 mmol/L (ref 135–146)

## 2022-06-25 LAB — VITAMIN D 25 HYDROXY (VIT D DEFICIENCY, FRACTURES): Vit D, 25-Hydroxy: 66 ng/mL (ref 30–100)

## 2022-08-15 ENCOUNTER — Other Ambulatory Visit: Payer: Self-pay | Admitting: Obstetrics and Gynecology

## 2022-08-15 DIAGNOSIS — Z1231 Encounter for screening mammogram for malignant neoplasm of breast: Secondary | ICD-10-CM

## 2022-08-15 DIAGNOSIS — M810 Age-related osteoporosis without current pathological fracture: Secondary | ICD-10-CM

## 2022-09-18 DIAGNOSIS — F319 Bipolar disorder, unspecified: Secondary | ICD-10-CM | POA: Diagnosis not present

## 2022-10-03 DIAGNOSIS — Z1339 Encounter for screening examination for other mental health and behavioral disorders: Secondary | ICD-10-CM | POA: Diagnosis not present

## 2022-10-03 DIAGNOSIS — Z79899 Other long term (current) drug therapy: Secondary | ICD-10-CM | POA: Diagnosis not present

## 2022-10-03 DIAGNOSIS — Z1331 Encounter for screening for depression: Secondary | ICD-10-CM | POA: Diagnosis not present

## 2022-10-03 DIAGNOSIS — Z0000 Encounter for general adult medical examination without abnormal findings: Secondary | ICD-10-CM | POA: Diagnosis not present

## 2022-10-03 DIAGNOSIS — E785 Hyperlipidemia, unspecified: Secondary | ICD-10-CM | POA: Diagnosis not present

## 2022-10-03 DIAGNOSIS — Z6821 Body mass index (BMI) 21.0-21.9, adult: Secondary | ICD-10-CM | POA: Diagnosis not present

## 2022-10-15 DIAGNOSIS — M654 Radial styloid tenosynovitis [de Quervain]: Secondary | ICD-10-CM | POA: Diagnosis not present

## 2022-10-25 ENCOUNTER — Ambulatory Visit
Admission: RE | Admit: 2022-10-25 | Discharge: 2022-10-25 | Disposition: A | Discharge status: Home / Self Care | Payer: PPO | Source: Ambulatory Visit | Attending: Obstetrics and Gynecology | Admitting: Obstetrics and Gynecology

## 2022-10-25 ENCOUNTER — Ambulatory Visit
Admission: RE | Admit: 2022-10-25 | Discharge: 2022-10-25 | Disposition: A | Discharge status: Home / Self Care | Payer: BC Managed Care – PPO | Source: Ambulatory Visit | Attending: Obstetrics and Gynecology | Admitting: Obstetrics and Gynecology

## 2022-10-25 DIAGNOSIS — M810 Age-related osteoporosis without current pathological fracture: Secondary | ICD-10-CM

## 2022-10-25 DIAGNOSIS — M8588 Other specified disorders of bone density and structure, other site: Secondary | ICD-10-CM | POA: Diagnosis not present

## 2022-10-25 DIAGNOSIS — Z1231 Encounter for screening mammogram for malignant neoplasm of breast: Secondary | ICD-10-CM

## 2022-10-25 DIAGNOSIS — E349 Endocrine disorder, unspecified: Secondary | ICD-10-CM | POA: Diagnosis not present

## 2022-10-25 DIAGNOSIS — N958 Other specified menopausal and perimenopausal disorders: Secondary | ICD-10-CM | POA: Diagnosis not present

## 2022-10-28 ENCOUNTER — Telehealth: Payer: Self-pay | Admitting: Gastroenterology

## 2022-10-28 NOTE — Telephone Encounter (Signed)
PT recently found out her osteoporosis is getting worse and she wants to find out how or is she can be able to stop taking omeprazole. Please advise

## 2022-10-28 NOTE — Telephone Encounter (Signed)
Pt stated that she has had osteoporosis for about 10 years and she recently had a bone density test that stated that things were getting worse. Pt stated that she has been taking the omeprazole 20 mg for a while now and was requesting if she could come off of that or if she could wean herself off of that. Please advise.

## 2022-10-28 NOTE — Telephone Encounter (Signed)
Left message for pt to call back  °

## 2022-10-29 ENCOUNTER — Telehealth: Payer: Self-pay

## 2022-10-29 NOTE — Telephone Encounter (Signed)
Lets try it every other day x next 3 months, then as needed RG

## 2022-10-29 NOTE — Telephone Encounter (Signed)
Other oral medications like Fosamax and Boniva can cause similar side effects as the Actonel.   I wish I had another option for her that is oral.   The Prolia and Reclast would really be her choices at this point. I understand that she is currently declining these.   I recommend weight bearing exercise, calcium 1200 mg daily in divided doses, and at least 600 - 800 international units of vitamin D3.

## 2022-10-29 NOTE — Telephone Encounter (Signed)
Options are Prolia which is injected or Evista which is an IV infusion.   I do not recall what happened when she took Actonel in the past and why it was discontinued.  There are other medications in the same class of drugs, like Fosamax.

## 2022-10-29 NOTE — Telephone Encounter (Signed)
Spoke with patient, advised per Dr. Edward Jolly.   Patient declines Prolia and Reclast.  Has tried Actonel in the past, stopped due to burning sensation in chest and esophagus. Was on Evista, had to stop due to atherosclerosis.   Patient would like to try another oral medication. Advised I will forward to Dr. Edward Jolly to review and our office will f/u. Patient agreeable.

## 2022-10-29 NOTE — Telephone Encounter (Signed)
Pt made aware of Dr. Lyndel Safe recommendations: Pt verbalized understanding with all questions answered.

## 2022-10-29 NOTE — Telephone Encounter (Signed)
Pt LVM in triage line stating that per her last OV with you, you guys discussed mgmt options for her osteoporosis. Pt reported that she still doesn't have the desire to have any long-acting forms of mgmt for this injected into her body. Are there any other recommendations for her?  Please advise.

## 2022-10-30 NOTE — Telephone Encounter (Signed)
Pt notified and voiced understanding. Routing to provider for final review and closing encounter.

## 2022-11-04 DIAGNOSIS — M4317 Spondylolisthesis, lumbosacral region: Secondary | ICD-10-CM | POA: Diagnosis not present

## 2022-12-18 DIAGNOSIS — F319 Bipolar disorder, unspecified: Secondary | ICD-10-CM | POA: Diagnosis not present

## 2023-03-31 ENCOUNTER — Encounter: Payer: Self-pay | Admitting: Obstetrics and Gynecology

## 2023-04-01 ENCOUNTER — Other Ambulatory Visit: Payer: Self-pay | Admitting: Obstetrics and Gynecology

## 2023-04-01 MED ORDER — ALENDRONATE SODIUM 70 MG PO TABS: 70.0000 mg | ORAL_TABLET | ORAL | 0 refills | Status: DC

## 2023-04-01 NOTE — Progress Notes (Signed)
Rx for Fosamax.

## 2023-05-27 DIAGNOSIS — M4317 Spondylolisthesis, lumbosacral region: Secondary | ICD-10-CM | POA: Diagnosis not present

## 2023-06-04 ENCOUNTER — Encounter: Payer: Self-pay | Admitting: Gastroenterology

## 2023-06-16 ENCOUNTER — Other Ambulatory Visit: Payer: Self-pay | Admitting: Obstetrics and Gynecology

## 2023-06-16 ENCOUNTER — Other Ambulatory Visit: Payer: Self-pay

## 2023-06-16 MED ORDER — ALENDRONATE SODIUM 70 MG PO TABS: 70.0000 mg | ORAL_TABLET | ORAL | 0 refills | Status: DC

## 2023-06-16 NOTE — Telephone Encounter (Signed)
 Med refill request: Fosamax Last AEX: 05/11/21 (LR MCR) Next AEX:07/01/23 Last MMG (if hormonal med) 10/15/22 Refill authorized: Please Advise?
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
## 2023-07-01 ENCOUNTER — Ambulatory Visit: Payer: PPO | Admitting: Obstetrics and Gynecology

## 2023-07-01 ENCOUNTER — Encounter: Payer: Self-pay | Admitting: Obstetrics and Gynecology

## 2023-07-01 VITALS — BP 114/78 | HR 65 | Ht 65.25 in | Wt 137.0 lb

## 2023-07-01 DIAGNOSIS — Z5181 Encounter for therapeutic drug level monitoring: Secondary | ICD-10-CM

## 2023-07-01 DIAGNOSIS — M810 Age-related osteoporosis without current pathological fracture: Secondary | ICD-10-CM | POA: Diagnosis not present

## 2023-07-01 MED ORDER — ALENDRONATE SODIUM 70 MG PO TABS: 70.0000 mg | ORAL_TABLET | ORAL | 3 refills | Status: DC

## 2023-08-22 ENCOUNTER — Telehealth: Payer: Self-pay | Admitting: Cardiology

## 2023-08-22 NOTE — Telephone Encounter (Signed)
 Patient wants a provider switch from Dr. Micael Adas to Dr. Renna Cary.  Please confirm.
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
## 2023-09-01 ENCOUNTER — Encounter: Payer: Self-pay | Admitting: Obstetrics and Gynecology

## 2023-09-01 ENCOUNTER — Other Ambulatory Visit (HOSPITAL_COMMUNITY)
Admission: RE | Admit: 2023-09-01 | Discharge: 2023-09-01 | Disposition: A | Discharge status: Home / Self Care | Source: Ambulatory Visit | Attending: Obstetrics and Gynecology | Admitting: Obstetrics and Gynecology

## 2023-09-01 ENCOUNTER — Ambulatory Visit: Payer: PPO | Admitting: Obstetrics and Gynecology

## 2023-09-01 VITALS — BP 114/76 | HR 72 | Ht 65.0 in | Wt 137.0 lb

## 2023-09-01 DIAGNOSIS — Z1151 Encounter for screening for human papillomavirus (HPV): Secondary | ICD-10-CM | POA: Insufficient documentation

## 2023-09-01 DIAGNOSIS — Z01419 Encounter for gynecological examination (general) (routine) without abnormal findings: Secondary | ICD-10-CM | POA: Insufficient documentation

## 2023-09-01 DIAGNOSIS — M810 Age-related osteoporosis without current pathological fracture: Secondary | ICD-10-CM | POA: Diagnosis not present

## 2023-09-01 DIAGNOSIS — Z124 Encounter for screening for malignant neoplasm of cervix: Secondary | ICD-10-CM

## 2023-09-01 DIAGNOSIS — Z5181 Encounter for therapeutic drug level monitoring: Secondary | ICD-10-CM

## 2023-09-01 MED ORDER — ALENDRONATE SODIUM 70 MG PO TABS: 70.0000 mg | ORAL_TABLET | ORAL | 3 refills | Status: AC

## 2023-09-01 NOTE — Patient Instructions (Signed)

## 2023-09-03 LAB — CYTOLOGY - PAP
Comment: NEGATIVE
Diagnosis: NEGATIVE
High risk HPV: NEGATIVE

## 2023-09-04 ENCOUNTER — Ambulatory Visit: Payer: Self-pay | Admitting: Obstetrics and Gynecology

## 2023-09-09 ENCOUNTER — Encounter

## 2023-09-09 DIAGNOSIS — Z1231 Encounter for screening mammogram for malignant neoplasm of breast: Secondary | ICD-10-CM

## 2023-09-10 DIAGNOSIS — M6740 Ganglion, unspecified site: Secondary | ICD-10-CM | POA: Diagnosis not present

## 2023-09-10 DIAGNOSIS — M654 Radial styloid tenosynovitis [de Quervain]: Secondary | ICD-10-CM | POA: Diagnosis not present

## 2023-09-10 DIAGNOSIS — M180 Bilateral primary osteoarthritis of first carpometacarpal joints: Secondary | ICD-10-CM | POA: Diagnosis not present

## 2023-09-10 DIAGNOSIS — M67441 Ganglion, right hand: Secondary | ICD-10-CM | POA: Diagnosis not present

## 2023-09-10 DIAGNOSIS — M25532 Pain in left wrist: Secondary | ICD-10-CM | POA: Diagnosis not present

## 2023-09-11 ENCOUNTER — Ambulatory Visit

## 2023-09-11 VITALS — Ht 65.25 in | Wt 137.0 lb

## 2023-09-11 DIAGNOSIS — Z860100 Personal history of colon polyps, unspecified: Secondary | ICD-10-CM

## 2023-09-11 MED ORDER — NA SULFATE-K SULFATE-MG SULF 17.5-3.13-1.6 GM/177ML PO SOLN: 1.0000 | Freq: Once | ORAL | 0 refills | Status: AC

## 2023-09-11 NOTE — Progress Notes (Signed)

## 2023-09-17 ENCOUNTER — Encounter: Payer: Self-pay | Admitting: Gastroenterology

## 2023-09-23 DIAGNOSIS — L218 Other seborrheic dermatitis: Secondary | ICD-10-CM | POA: Diagnosis not present

## 2023-09-23 DIAGNOSIS — L820 Inflamed seborrheic keratosis: Secondary | ICD-10-CM | POA: Diagnosis not present

## 2023-09-23 DIAGNOSIS — D485 Neoplasm of uncertain behavior of skin: Secondary | ICD-10-CM | POA: Diagnosis not present

## 2023-10-01 ENCOUNTER — Encounter: Payer: Self-pay | Admitting: Gastroenterology

## 2023-10-01 ENCOUNTER — Ambulatory Visit: Admitting: Gastroenterology

## 2023-10-01 VITALS — BP 138/71 | HR 60 | Temp 97.5°F | Resp 12 | Ht 65.25 in | Wt 137.0 lb

## 2023-10-01 DIAGNOSIS — D128 Benign neoplasm of rectum: Secondary | ICD-10-CM

## 2023-10-01 DIAGNOSIS — Z860100 Personal history of colon polyps, unspecified: Secondary | ICD-10-CM

## 2023-10-01 DIAGNOSIS — K640 First degree hemorrhoids: Secondary | ICD-10-CM

## 2023-10-01 DIAGNOSIS — Z1211 Encounter for screening for malignant neoplasm of colon: Secondary | ICD-10-CM | POA: Diagnosis not present

## 2023-10-01 DIAGNOSIS — K5730 Diverticulosis of large intestine without perforation or abscess without bleeding: Secondary | ICD-10-CM

## 2023-10-01 DIAGNOSIS — K621 Rectal polyp: Secondary | ICD-10-CM | POA: Diagnosis not present

## 2023-10-01 DIAGNOSIS — F319 Bipolar disorder, unspecified: Secondary | ICD-10-CM | POA: Diagnosis not present

## 2023-10-01 MED ORDER — SODIUM CHLORIDE 0.9 % IV SOLN: 500.0000 mL | Freq: Once | INTRAVENOUS | Status: DC
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
## 2023-10-01 NOTE — Progress Notes (Signed)
 Report to PACU, RN, vss, BBS= Clear.

## 2023-10-01 NOTE — Progress Notes (Signed)
 Called to room to assist during endoscopic procedure.  Patient ID and intended procedure confirmed with present staff. Received instructions for my participation in the procedure from the performing physician.

## 2023-10-01 NOTE — Progress Notes (Signed)
 Pt's states no medical or surgical changes since previsit or office visit.

## 2023-10-01 NOTE — Op Note (Signed)
 [facility] Patient Name: April Duffy Procedure Date: 10/01/2023 8:24 AM MRN: [id number] Endoscopist: Lynnie Bring , MD, [id number] Age: 73 Referring MD:  Date of Birth: 08/28/1950 Gender: Female Account #: [id number] Procedure:                Colonoscopy Indications:              High risk colon cancer surveillance: Personal                            history of colonic polyps. LLQ pain with negative CT Medicines:                Monitored Anesthesia Care Procedure:                Pre-Anesthesia Assessment:                           - Prior to the procedure, a History and Physical                            was performed, and patient medications and                            allergies were reviewed. The patient's tolerance of                            previous anesthesia was also reviewed. The risks                            and benefits of the procedure and the sedation                            options and risks were discussed with the patient.                            All questions were answered, and informed consent                            was obtained. Prior Anticoagulants: The patient has                            taken no anticoagulant or antiplatelet agents. ASA                            Grade Assessment: II - A patient with mild systemic                            disease. After reviewing the risks and benefits,                            the patient was deemed in satisfactory condition to                            undergo the procedure.  After obtaining informed consent, the colonoscope                            was passed under direct vision. Throughout the                            procedure, the patient's blood pressure, pulse, and                            oxygen saturations were monitored continuously. The                            Olympus Scope SN: [id number] was introduced through                            the anus  and advanced to the 2 cm into the ileum.                            The colonoscopy was performed without difficulty.                            The patient tolerated the procedure well. The                            quality of the bowel preparation was good. The                            terminal ileum, ileocecal valve, appendiceal                            orifice, and rectum were photographed. Scope In: 8:29:25 AM Scope Out: 8:47:32 AM Scope Withdrawal Time: 0 hours 10 minutes 33 seconds  Total Procedure Duration: 0 hours 18 minutes 7 seconds  Findings:                 A 4 mm polyp was found in the mid rectum. The polyp                            was sessile. The polyp was removed with a cold                            biopsy forceps. Resection and retrieval were                            complete.                           Rare (1-2) small-mouthed diverticula were found in                            the sigmoid colon.                           Non-bleeding internal hemorrhoids were found during  retroflexion. The hemorrhoids were small and Grade                            I (internal hemorrhoids that do not prolapse).                           The terminal ileum appeared normal.                           The colon (entire examined portion) was moderately                            tortuous. Advancing the scope required using manual                            pressure.                           Retroflexion in the right colon was performed.                           The exam was otherwise without abnormality on                            direct and retroflexion views. Complications:            No immediate complications. Estimated Blood Loss:     Estimated blood loss: none. Impression:               - One 4 mm polyp in the mid rectum, removed with a                            cold biopsy forceps. Resected and retrieved.                           - Very  minimal sigmoid diverticulosis.                           - Non-bleeding internal hemorrhoids.                           - The examined portion of the ileum was normal.                           - The examination was otherwise normal on direct                            and retroflexion views. Recommendation:           - Patient has a contact number available for                            emergencies. The signs and symptoms of potential                            delayed complications were discussed with the  patient. Return to normal activities tomorrow.                            Written discharge instructions were provided to the                            patient.                           - Resume previous diet.                           - Continue present medications including MiraLAX.                           - Await pathology results.                           - Repeat colonoscopy for surveillance based on                            pathology results.                           - The findings and recommendations were discussed                            with the patient's family. Lynnie Bring, MD 10/01/2023 8:55:23 AM This report has been signed electronically.

## 2023-10-01 NOTE — Patient Instructions (Signed)
 Resume previous diet  Continue present medications including Miralax Await pathology results See handouts for polyps and hemorrhoids   YOU HAD AN ENDOSCOPIC PROCEDURE TODAY AT THE [facility]:   Refer to the procedure report that was given to you for any specific questions about what was found during the examination.  If the procedure report does not answer your questions, please call your gastroenterologist to clarify.  If you requested that your care partner not be given the details of your procedure findings, then the procedure report has been included in a sealed envelope for you to review at your convenience later.  YOU SHOULD EXPECT: Some feelings of bloating in the abdomen. Passage of more gas than usual.  Walking can help get rid of the air that was put into your GI tract during the procedure and reduce the bloating. If you had a lower endoscopy (such as a colonoscopy or flexible sigmoidoscopy) you may notice spotting of blood in your stool or on the toilet paper. If you underwent a bowel prep for your procedure, you may not have a normal bowel movement for a few days.  Please Note:  You might notice some irritation and congestion in your nose or some drainage.  This is from the oxygen used during your procedure.  There is no need for concern and it should clear up in a day or so.  SYMPTOMS TO REPORT IMMEDIATELY:  Following lower endoscopy (colonoscopy or flexible sigmoidoscopy):  Excessive amounts of blood in the stool  Significant tenderness or worsening of abdominal pains  Swelling of the abdomen that is new, acute  Fever of 100F or higher  For urgent or emergent issues, a gastroenterologist can be reached at any hour by calling [phone]. Do not use MyChart messaging for urgent concerns.   DIET:  We do recommend a small meal at first, but then you may proceed to your regular diet.  Drink plenty of fluids but you should avoid alcoholic beverages for 24  hours.  ACTIVITY:  You should plan to take it easy for the rest of today and you should NOT DRIVE or use heavy machinery until tomorrow (because of the sedation medicines used during the test).    FOLLOW UP: Our staff will call the number listed on your records the next business day following your procedure.  We will call around 7:15- 8:00 am to check on you and address any questions or concerns that you may have regarding the information given to you following your procedure. If we do not reach you, we will leave a message.     If any biopsies were taken you will be contacted by phone or by letter within the next 1-3 weeks.  Please call us  at [phone] if you have not heard about the biopsies in 3 weeks.   SIGNATURES/CONFIDENTIALITY: You and/or your care partner have signed paperwork which will be entered into your electronic medical record.  These signatures attest to the fact that that the information above on your After Visit Summary has been reviewed and is understood.  Full responsibility of the confidentiality of this discharge information lies with you and/or your care-partner.

## 2023-10-02 ENCOUNTER — Telehealth: Payer: Self-pay

## 2023-10-02 NOTE — Telephone Encounter (Signed)
 Follow up call to pt, lm for pt to call if having any difficulty with normal activities or eating and drinking.  Also to call if any other questions or concerns.

## 2023-10-03 LAB — SURGICAL PATHOLOGY

## 2023-10-09 ENCOUNTER — Ambulatory Visit: Payer: Self-pay | Admitting: Gastroenterology

## 2023-10-13 ENCOUNTER — Other Ambulatory Visit: Payer: Self-pay | Admitting: Obstetrics and Gynecology

## 2023-10-13 DIAGNOSIS — Z1231 Encounter for screening mammogram for malignant neoplasm of breast: Secondary | ICD-10-CM

## 2023-10-31 ENCOUNTER — Ambulatory Visit
Admission: RE | Admit: 2023-10-31 | Discharge: 2023-10-31 | Disposition: A | Discharge status: Home / Self Care | Source: Ambulatory Visit | Attending: Obstetrics and Gynecology | Admitting: Obstetrics and Gynecology

## 2023-10-31 DIAGNOSIS — Z1231 Encounter for screening mammogram for malignant neoplasm of breast: Secondary | ICD-10-CM | POA: Diagnosis not present

## 2023-11-04 ENCOUNTER — Ambulatory Visit: Payer: Self-pay | Admitting: Obstetrics and Gynecology

## 2023-12-12 ENCOUNTER — Encounter: Payer: Self-pay | Admitting: Cardiology

## 2023-12-12 ENCOUNTER — Ambulatory Visit: Attending: Cardiology | Admitting: Cardiology

## 2023-12-12 VITALS — BP 104/68 | HR 74 | Ht 65.0 in | Wt 128.6 lb

## 2023-12-12 DIAGNOSIS — I700 Atherosclerosis of aorta: Secondary | ICD-10-CM

## 2023-12-12 DIAGNOSIS — Z8249 Family history of ischemic heart disease and other diseases of the circulatory system: Secondary | ICD-10-CM

## 2023-12-12 NOTE — Progress Notes (Signed)
 Cardiology Office Note:  .   Date:  12/12/2023  ID:  April Duffy, DOB November 01, 1950, MRN [id number] PCP: Ina Marcellus RAMAN, MD  [facility] Providers Cardiologist:  Oneil Parchment, MD     History of Present Illness: .   April Duffy is a 73 y.o. female Discussed the use of AI scribe software   History of Present Illness April Duffy is a 73 year old female who presents for follow-up regarding her small amount of aortic atherosclerosis distally coronary calcium score and family history of heart disease.  She has a coronary calcium score of zero from a scan done on November 29, 2019. She is concerned about aortic atherosclerosis, noted as a small amount of plaque in the descending aorta on a previous scan, especially given her family history of heart disease.  Her family history is significant for heart disease. Her mother had a pacemaker for the last twenty years of her life, and several of her mother's siblings died from heart attacks. Her father had twelve blockages, underwent heart surgery, and passed away in recovery. She is worried about her genetic predisposition to heart disease.  Her past medical history includes bipolar disorder, irritable bowel syndrome, premature ovarian failure, anorexia, and aortic atherosclerosis. She has never smoked and maintains a healthy lifestyle, including walking two miles a day and adhering to a good diet. Her prior LDL was 60, and her HDL was 118. Her most recent LDL cholesterol was 74, with normal kidney and liver function.  She is vigilant about her health due to her family history and wishes to see her grandchildren grow up.     Music major, piano ROS: No CP, no SOB  Studies Reviewed: SABRA   EKG Interpretation Date/Time:  Friday December 12 2023 08:25:56 EDT Ventricular Rate:  78 PR Interval:  162 QRS Duration:  84 QT Interval:  360 QTC Calculation: 410 R Axis:   86  Text Interpretation: Normal sinus rhythm Normal ECG No previous  ECGs available Confirmed by Parchment Oneil ([id number]) on 12/12/2023 8:27:45 AM    Results LABS LDL: 74 Creatinine: 0.8  RADIOLOGY Coronary calcium score: 0 (11/29/2019) CT chest: Normal ascending aorta CT abdomen/pelvis: Atherosclerosis in descending aorta Risk Assessment/Calculations:           Physical Exam:   VS:  BP 104/68   Pulse 74   Ht 5' 5 (1.651 m)   Wt 128 lb 9.6 oz (58.3 kg)   LMP 03/11/2000   SpO2 98%   BMI 21.40 kg/m    Wt Readings from Last 3 Encounters:  12/12/23 128 lb 9.6 oz (58.3 kg)  10/01/23 137 lb (62.1 kg)  09/11/23 137 lb (62.1 kg)    GEN: Well nourished, well developed in no acute distress NECK: No JVD; No carotid bruits CARDIAC: RRR, no murmurs, no rubs, no gallops RESPIRATORY:  Clear to auscultation without rales, wheezing or rhonchi  ABDOMEN: Soft, non-tender, non-distended EXTREMITIES:  No edema; No deformity   ASSESSMENT AND PLAN: .    Assessment and Plan Assessment & Plan Atherosclerosis of the descending aorta Family history of early coronary artery disease Mild atherosclerosis of the descending aorta with a small amount of calcium.  Personally reviewed images and showed them.  No evidence of aneurysm. Coronary calcium score is zero, indicating no calcific coronary artery plaque. Family history of heart disease is significant, but current findings are reassuring. No symptoms suggestive of peripheral artery disease. The risk of progression is low, and further imaging  is not necessary unless symptoms develop. - Maintain a healthy diet and regular exercise. - Monitor for symptoms such as claudication. - Avoid unnecessary radiation exposure from additional scans. - Follow up with primary care physician for routine monitoring. - Return to cardiology if new symptoms develop or if advised by primary care physician.         Dispo: Graduate from cardiology clinic.  Please let us  know if we can assistance.  Signed, Oneil Parchment, MD

## 2023-12-25 ENCOUNTER — Encounter: Payer: Self-pay | Admitting: Obstetrics and Gynecology

## 2024-01-12 DIAGNOSIS — M4722 Other spondylosis with radiculopathy, cervical region: Secondary | ICD-10-CM | POA: Diagnosis not present

## 2024-01-12 DIAGNOSIS — M4317 Spondylolisthesis, lumbosacral region: Secondary | ICD-10-CM | POA: Diagnosis not present

## 2024-02-19 DIAGNOSIS — M4722 Other spondylosis with radiculopathy, cervical region: Secondary | ICD-10-CM | POA: Diagnosis not present

## 2024-02-19 DIAGNOSIS — M2560 Stiffness of unspecified joint, not elsewhere classified: Secondary | ICD-10-CM | POA: Diagnosis not present

## 2024-02-19 DIAGNOSIS — R531 Weakness: Secondary | ICD-10-CM | POA: Diagnosis not present

## 2024-02-19 DIAGNOSIS — R293 Abnormal posture: Secondary | ICD-10-CM | POA: Diagnosis not present

## 2024-03-29 ENCOUNTER — Encounter: Payer: Self-pay | Admitting: Obstetrics and Gynecology

## 2024-03-31 ENCOUNTER — Other Ambulatory Visit: Payer: Self-pay | Admitting: Obstetrics and Gynecology

## 2024-03-31 DIAGNOSIS — M810 Age-related osteoporosis without current pathological fracture: Secondary | ICD-10-CM

## 2024-03-31 NOTE — Telephone Encounter (Signed)
 Dr Nikki, please put in dx for bone density. Thanks

## 2024-09-01 ENCOUNTER — Ambulatory Visit: Admitting: Obstetrics and Gynecology
